# Patient Record
Sex: Female | Born: 1970 | Race: Black or African American | Hispanic: No | Smoking: Never smoker
Health system: Southern US, Community
[De-identification: ages and names within clinical notes are randomized; demographics above are authoritative.]

## PROBLEM LIST (undated history)

## (undated) DIAGNOSIS — M1990 Unspecified osteoarthritis, unspecified site: Secondary | ICD-10-CM

## (undated) DIAGNOSIS — K219 Gastro-esophageal reflux disease without esophagitis: Secondary | ICD-10-CM

## (undated) DIAGNOSIS — K589 Irritable bowel syndrome without diarrhea: Secondary | ICD-10-CM

## (undated) DIAGNOSIS — E785 Hyperlipidemia, unspecified: Secondary | ICD-10-CM

## (undated) DIAGNOSIS — A6000 Herpesviral infection of urogenital system, unspecified: Secondary | ICD-10-CM

## (undated) HISTORY — DX: Irritable bowel syndrome, unspecified: K58.9

## (undated) HISTORY — PX: WISDOM TOOTH EXTRACTION: SHX21

## (undated) HISTORY — DX: Gastro-esophageal reflux disease without esophagitis: K21.9

## (undated) HISTORY — DX: Hyperlipidemia, unspecified: E78.5

## (undated) HISTORY — DX: Unspecified osteoarthritis, unspecified site: M19.90

---

## 1998-02-06 ENCOUNTER — Other Ambulatory Visit: Admission: RE | Admit: 1998-02-06 | Discharge: 1998-02-06 | Payer: Self-pay | Admitting: *Deleted

## 1998-02-16 ENCOUNTER — Encounter: Admission: RE | Admit: 1998-02-16 | Discharge: 1998-02-16 | Payer: Self-pay | Admitting: Family Medicine

## 1999-03-26 ENCOUNTER — Other Ambulatory Visit: Admission: RE | Admit: 1999-03-26 | Discharge: 1999-03-26 | Payer: Self-pay | Admitting: *Deleted

## 2005-01-16 ENCOUNTER — Other Ambulatory Visit: Admission: RE | Admit: 2005-01-16 | Discharge: 2005-01-16 | Payer: Self-pay | Admitting: *Deleted

## 2005-10-29 ENCOUNTER — Encounter: Admission: RE | Admit: 2005-10-29 | Discharge: 2005-10-29 | Payer: Self-pay | Admitting: *Deleted

## 2005-12-23 ENCOUNTER — Other Ambulatory Visit: Admission: RE | Admit: 2005-12-23 | Discharge: 2005-12-23 | Payer: Self-pay | Admitting: *Deleted

## 2006-11-02 ENCOUNTER — Encounter: Admission: RE | Admit: 2006-11-02 | Discharge: 2006-11-02 | Payer: Self-pay | Admitting: *Deleted

## 2006-12-09 ENCOUNTER — Other Ambulatory Visit: Admission: RE | Admit: 2006-12-09 | Discharge: 2006-12-09 | Payer: Self-pay | Admitting: *Deleted

## 2007-07-12 ENCOUNTER — Ambulatory Visit: Payer: Self-pay | Admitting: Gastroenterology

## 2007-08-16 ENCOUNTER — Ambulatory Visit: Payer: Self-pay | Admitting: Gastroenterology

## 2007-10-28 HISTORY — PX: ANAL FISSURE REPAIR: SHX2312

## 2007-10-28 HISTORY — PX: SIGMOIDOSCOPY: SUR1295

## 2007-11-03 ENCOUNTER — Ambulatory Visit: Payer: Self-pay | Admitting: Gastroenterology

## 2007-11-15 ENCOUNTER — Encounter: Payer: Self-pay | Admitting: Gastroenterology

## 2007-11-15 ENCOUNTER — Ambulatory Visit (HOSPITAL_COMMUNITY): Admission: RE | Admit: 2007-11-15 | Discharge: 2007-11-15 | Payer: Self-pay | Admitting: Gastroenterology

## 2007-11-15 DIAGNOSIS — K648 Other hemorrhoids: Secondary | ICD-10-CM | POA: Insufficient documentation

## 2007-11-17 ENCOUNTER — Ambulatory Visit: Payer: Self-pay | Admitting: Gastroenterology

## 2007-12-30 DIAGNOSIS — K219 Gastro-esophageal reflux disease without esophagitis: Secondary | ICD-10-CM

## 2008-01-03 ENCOUNTER — Other Ambulatory Visit: Admission: RE | Admit: 2008-01-03 | Discharge: 2008-01-03 | Payer: Self-pay | Admitting: *Deleted

## 2008-01-17 ENCOUNTER — Ambulatory Visit: Payer: Self-pay | Admitting: Family Medicine

## 2008-01-17 DIAGNOSIS — Z9189 Other specified personal risk factors, not elsewhere classified: Secondary | ICD-10-CM

## 2008-01-17 DIAGNOSIS — K589 Irritable bowel syndrome without diarrhea: Secondary | ICD-10-CM | POA: Insufficient documentation

## 2008-01-18 ENCOUNTER — Ambulatory Visit: Payer: Self-pay | Admitting: Family Medicine

## 2008-01-18 LAB — CONVERTED CEMR LAB
Glucose, Urine, Semiquant: NEGATIVE
Protein, U semiquant: NEGATIVE
Specific Gravity, Urine: 1.015
WBC Urine, dipstick: NEGATIVE

## 2008-01-19 ENCOUNTER — Ambulatory Visit: Payer: Self-pay | Admitting: Family Medicine

## 2008-01-19 DIAGNOSIS — B009 Herpesviral infection, unspecified: Secondary | ICD-10-CM | POA: Insufficient documentation

## 2008-01-19 DIAGNOSIS — K602 Anal fissure, unspecified: Secondary | ICD-10-CM | POA: Insufficient documentation

## 2008-01-19 LAB — CONVERTED CEMR LAB
ALT: 8 units/L (ref 0–35)
AST: 12 units/L (ref 0–37)
BUN: 7 mg/dL (ref 6–23)
Basophils Absolute: 0 10*3/uL (ref 0.0–0.1)
Basophils Relative: 0.5 % (ref 0.0–1.0)
CO2: 24 meq/L (ref 19–32)
Calcium: 9.3 mg/dL (ref 8.4–10.5)
Creatinine, Ser: 0.7 mg/dL (ref 0.4–1.2)
Direct LDL: 129.9 mg/dL
Eosinophils Relative: 2.8 % (ref 0.0–5.0)
Glucose, Bld: 86 mg/dL (ref 70–99)
Hemoglobin: 11 g/dL — ABNORMAL LOW (ref 12.0–15.0)
Lymphocytes Relative: 27.5 % (ref 12.0–46.0)
MCHC: 31.4 g/dL (ref 30.0–36.0)
Monocytes Relative: 5.8 % (ref 3.0–11.0)
Neutro Abs: 4 10*3/uL (ref 1.4–7.7)
RBC: 4.37 M/uL (ref 3.87–5.11)
TSH: 0.95 microintl units/mL (ref 0.35–5.50)
Total Protein: 6.8 g/dL (ref 6.0–8.3)
VLDL: 19 mg/dL (ref 0–40)
WBC: 6.4 10*3/uL (ref 4.5–10.5)

## 2008-01-20 ENCOUNTER — Telehealth (INDEPENDENT_AMBULATORY_CARE_PROVIDER_SITE_OTHER): Payer: Self-pay | Admitting: *Deleted

## 2008-01-24 ENCOUNTER — Other Ambulatory Visit (INDEPENDENT_AMBULATORY_CARE_PROVIDER_SITE_OTHER): Payer: Self-pay | Admitting: General Surgery

## 2008-01-24 ENCOUNTER — Ambulatory Visit (HOSPITAL_COMMUNITY): Admission: RE | Admit: 2008-01-24 | Discharge: 2008-01-24 | Payer: Self-pay | Admitting: General Surgery

## 2008-02-24 ENCOUNTER — Encounter: Admission: RE | Admit: 2008-02-24 | Discharge: 2008-02-24 | Payer: Self-pay | Admitting: *Deleted

## 2008-03-09 ENCOUNTER — Encounter: Admission: RE | Admit: 2008-03-09 | Discharge: 2008-03-09 | Payer: Self-pay | Admitting: *Deleted

## 2008-10-31 ENCOUNTER — Ambulatory Visit: Payer: Self-pay | Admitting: Family Medicine

## 2008-10-31 DIAGNOSIS — J019 Acute sinusitis, unspecified: Secondary | ICD-10-CM

## 2009-04-13 ENCOUNTER — Encounter: Admission: RE | Admit: 2009-04-13 | Discharge: 2009-04-13 | Payer: Self-pay | Admitting: Gynecology

## 2009-04-19 ENCOUNTER — Ambulatory Visit: Payer: Self-pay | Admitting: Family Medicine

## 2010-09-24 ENCOUNTER — Encounter: Admission: RE | Admit: 2010-09-24 | Discharge: 2010-09-24 | Payer: Self-pay | Admitting: Gynecology

## 2010-12-16 ENCOUNTER — Encounter: Payer: Self-pay | Admitting: Family Medicine

## 2010-12-16 ENCOUNTER — Ambulatory Visit (INDEPENDENT_AMBULATORY_CARE_PROVIDER_SITE_OTHER): Payer: BC Managed Care – PPO | Admitting: Family Medicine

## 2010-12-16 VITALS — BP 110/60 | HR 104 | Temp 98.8°F | Wt 120.0 lb

## 2010-12-16 DIAGNOSIS — R002 Palpitations: Secondary | ICD-10-CM

## 2010-12-16 LAB — CBC WITH DIFFERENTIAL/PLATELET
Basophils Relative: 0.4 % (ref 0.0–3.0)
Eosinophils Relative: 2 % (ref 0.0–5.0)
HCT: 36.5 % (ref 36.0–46.0)
MCV: 87.6 fl (ref 78.0–100.0)
Monocytes Absolute: 0.3 10*3/uL (ref 0.1–1.0)
Monocytes Relative: 5.7 % (ref 3.0–12.0)
Neutrophils Relative %: 63.6 % (ref 43.0–77.0)
Platelets: 308 10*3/uL (ref 150.0–400.0)
RBC: 4.16 Mil/uL (ref 3.87–5.11)
WBC: 5.9 10*3/uL (ref 4.5–10.5)

## 2010-12-16 LAB — HEPATIC FUNCTION PANEL
Bilirubin, Direct: 0.1 mg/dL (ref 0.0–0.3)
Total Bilirubin: 0.4 mg/dL (ref 0.3–1.2)
Total Protein: 7.2 g/dL (ref 6.0–8.3)

## 2010-12-16 LAB — TSH: TSH: 0.65 u[IU]/mL (ref 0.35–5.50)

## 2010-12-16 LAB — BASIC METABOLIC PANEL
BUN: 8 mg/dL (ref 6–23)
Calcium: 9.2 mg/dL (ref 8.4–10.5)
Creatinine, Ser: 0.6 mg/dL (ref 0.4–1.2)

## 2010-12-16 NOTE — Progress Notes (Signed)
  Subjective:    Patient ID: Kayla Shaw, female    DOB: 01-Dec-1970, 40 y.o.   MRN: [id number]  HPI Here to re-establish with [place] after an absence of about 3 years and to discuss palpitations. She has noticed these for the past 5 months. They are intermittent, though they have been more frequent in the past 2 weeks. She has been under some work stress, and she thinks these may be stress related. No SOB or chest pains. These are not related to exertion. She drinks a lot of caffeinated soda every day.   Review of Systems  Constitutional: Negative.   Respiratory: Negative.   Cardiovascular: Positive for palpitations. Negative for chest pain and leg swelling.  Gastrointestinal: Negative.   Psychiatric/Behavioral: The patient is nervous/anxious.        Objective:   Physical Exam  Constitutional: She appears well-developed and well-nourished.  Neck: Neck supple. No thyromegaly present.  Cardiovascular: Regular rhythm, normal heart sounds and intact distal pulses.   No extrasystoles are present. Tachycardia present.  Exam reveals no gallop and no friction rub.   No murmur heard.      EKG normal   Psychiatric: She has a normal mood and affect. Her behavior is normal. Judgment and thought content normal.          Assessment & Plan:  These palpitations do seem to be related to stress, and I reassured her about this. Advised her to cut back on her use of caffeine. Get more exercise. We talked about ways to decrease her stress levels.

## 2010-12-17 ENCOUNTER — Telehealth: Payer: Self-pay

## 2010-12-17 NOTE — Telephone Encounter (Signed)
Pt aware of lab results 

## 2010-12-17 NOTE — Telephone Encounter (Signed)
Message copied by Madison Hickman on Tue Dec 17, 2010  8:46 AM ------      Message from: Dwaine Deter      Created: Mon Dec 16, 2010  5:16 PM       normal

## 2011-03-11 NOTE — Letter (Signed)
July 12, 2007    Kayla Shaw   RE:  SHATORA, WEATHERBEE  MRN:  [id number]  /  DOB:  May 17, 1971   Dear Ms. Shaw:   It is my pleasure to have treated you recently as a new patient in my  office.  I appreciate your confidence and the opportunity to participate  in your care.   Since I do have a busy inpatient endoscopy schedule and office schedule,  my office hours vary weekly.  I am, however, available for emergency  calls every day through my office.  If I cannot promptly meet an urgent  office appointment, another one of our gastroenterologists will be able  to assist you.   My well-trained staff are prepared to help you at all times.  For  emergencies after office hours, a physician from our gastroenterology  section is always available through my 24-hour answering service.   While you are under my care, I encourage discussion of your questions  and concerns, and I will be happy to return your calls as soon as I am  available.   Once again, I welcome you as a new patient and I look forward to a happy  and healthy relationship.    Sincerely,      Barbette Hair. Arlyce Dice, MD,FACG  Electronically Signed   RDK/MedQ  DD: 07/12/2007  DT: 07/12/2007  Job #: [id number]

## 2011-03-11 NOTE — Assessment & Plan Note (Signed)
[facility]                         GASTROENTEROLOGY OFFICE NOTE   NAME:Kayla Shaw                       MRN:          [id number]  DATE:07/12/2007                            DOB:          27-Jun-1971    PROBLEM:  1. Nausea.  2. Hemorrhoids.   Ms. Kayla Shaw is a pleasant 40 year old African American female here for  evaluation of above.  She complains of nausea, most frequently in the  mornings.  It is improved with eating.  She complains of gas, bloating.  Bowels are somewhat erratic.  She is also suffering from minimal rectal  bleeding consisting of bright red blood on the toilet tissue.  She  denies rectal pain.  There is no history of pyrosis, sore throat,  coughing or melena.  She has a history of IBS.   PAST MEDICAL HISTORY:  Anal fissure that was operated on in 1995.   FAMILY HISTORY:  Noncontributory.   MEDICATIONS:  HyoMax p.r.n.   SHE HAS NO ALLERGIES.   She does not smoke, she has 4-5 drinks a week.  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   REVIEW OF SYSTEMS:  Was reviewed and is negative.   PHYSICAL EXAMINATION:  Pulse 54, blood pressure 106/66, weight 111.  HEENT: EOMI.  PERRLA.  Sclerae are anicteric.  Conjunctivae are pink.  NECK:  Supple without thyromegaly, adenopathy or carotid bruits.  CHEST:  Clear to auscultation and percussion without adventitious  sounds.  CARDIAC:  Regular rhythm; normal S1 S2.  There are no murmurs, gallops  or rubs.  ABDOMEN:  Bowel sounds are normoactive.  Abdomen is soft, nontender and  nondistended.  There are no abdominal masses, tenderness, splenic  enlargement or hepatomegaly.  EXTREMITIES:  Full range of motion.  No cyanosis, clubbing or edema.  RECTAL:  There are no external rectal lesions.   IMPRESSION:  1. Nausea.  Symptoms could be due to reflux.  2. Limited rectal bleeding -- Most likely secondary to hemorrhoids.   RECOMMENDATION:  1. Trial of Protonix 40 mg a day.  2. Anusol  HC suppositories nightly.  3. Ms. Kayla Shaw was carefully instructed to contact me in 3-4 days to      report her progress.  If her      nausea persists then I would consider upper endoscopy.  If her      bleeding does not improve with suppositories I would consider      sigmoidoscopy and possible banding of hemorrhoids.     Barbette Hair. Arlyce Dice, MD,FACG  Electronically Signed    RDK/MedQ  DD: 07/12/2007  DT: 07/12/2007  Job #: [id number]   cc:   Almedia Balls. Randell Patient, M.D.

## 2011-03-11 NOTE — Op Note (Signed)
NAMENASIAH, POLINSKY                ACCOUNT NO.:  [id number]   MEDICAL RECORD NO.:  [id number]          PATIENT TYPE:  AMB   LOCATION:  DAY                          FACILITY:  [facility]   PHYSICIAN:  Anselm Pancoast. Weatherly, M.D.DATE OF BIRTH:  [birth year]   DATE OF PROCEDURE:  01/24/2008  DATE OF DISCHARGE:                               OPERATIVE REPORT   PREOPERATIVE DIAGNOSIS:  Recurrent posterior anal fissure.   POSTOPERATIVE DIAGNOSIS:  Recurrent posterior anal fissure.   OPERATION:  Examination under anesthesia, posterior internal  sphincterotomy and excision of sentinel pile.  General anesthesia.   SURGEON:  Anselm Pancoast. Zachery Dakins, M.D.   HISTORY:  Kayla Shaw is a 40 year old female who approximately 10  years ago she had a internal sphincterotomy by history by Dr. Gerrit Friends.  I  have not been able to identify any records, but however for about six  months she has had recurring problems with bleeding, pain at the  posterior.  She was seen by Dr. Arlyce Dice who thought this was a hemorrhoid  and saw that it was a fissure.  It has been Botox injected, improved,  but then recurred and I saw her in the office last week.  You could see  the fissure posteriorly with a sentinel pile.  I could not see any  actual incision where she has had a previous internal sphincterotomy by  Dr. Gerrit Friends and I recommended that we do her with general anesthesia in  lithotomy position and could not find any evidence of an infection.  We  would do an internal sphincterotomy.  I tried to locate the old  operative record, but have not been successful.  The patient was  scheduled at [facility] today because of very busy schedule, it was  moved over here and she is here for the planned procedure at this time.   The patient preoperatively was given 3 grams of Unasyn and positioned up  in the stirrups after induction of general anesthesia with LMA tube.  First I inspected the anus very carefully.  I could not see any  evidence  of the little incision area where an internal sphincterotomy possibly  could have been performed and then with the anoscope there is nothing  anterior.  Posterior you could see an obvious fissure and there is a  little catch where the stool kind of hangs up and then there is also a  pretty large sentinel pile that is attached a little higher than the  usual pile.  I elected to prep the area with Betadine solution and then  used cautery to kind of nick the skin where this little catch lip was  and then used a hemostat to go between the normal sphincter and internal  sphincter up to the dentate line area.  I then elevated it this is all  posterior and divided this with cautery.  I then excised the little  sentinel pile and actually closed the mucosa and everything with 2-0  running chromic and a little single stitch on the outside so that the  little area is left open in case  there would be a little bit of drainage  but hopefully this will heal up the healing.  After inspecting now, she  is coming back with able to swallow and etc. I think I got a good  sphincterotomy performed and  then I put about 10 mL of 0.5% Marcaine with adrenaline about 5 mL of  each side for immediate postoperative pain.  I then put a little 4x4s  over the area, held in place with stretch panties and she will be  released after a short stay in recovery.           ______________________________  Anselm Pancoast. Zachery Dakins, M.D.     WJW/MEDQ  D:  01/24/2008  T:  01/25/2008  Job:  [id number]   cc:   Barbette Hair. Arlyce Dice, MD,FACG  [address]  [place]  [place] [address]

## 2011-03-11 NOTE — Assessment & Plan Note (Signed)
[facility]                         GASTROENTEROLOGY OFFICE NOTE   NAME:HARRISAdyn, Shaw                       MRN:          [id number]  DATE:11/03/2007                            DOB:          [birth year]    PROBLEM:  Rectal itching and discomfort.   Ms. Kayla Shaw has returned for followup.  Despite rectal suppositories, she  continues to complain of rectal itching and soreness.  Pain is  especially severe after a bowel movement.   EXAM:  Pulse 80, blood pressure 118/58, weight 114.   IMPRESSION:  Symptomatic internal hemorrhoids.  I think this is more  likely than an anal fissure.   RECOMMENDATIONS:  Sigmoidoscopy with band ligation of her hemorrhoids.  I will do a careful anoscopic exam to be certain that she does not have  a symptomatic fissure.  Should a fissure be seen, then I will not  proceed with band ligation.     Barbette Hair. Arlyce Dice, MD,FACG  Electronically Signed    RDK/MedQ  DD: 11/03/2007  DT: 11/03/2007  Job #: [id number]

## 2011-03-11 NOTE — Assessment & Plan Note (Signed)
[facility]                         GASTROENTEROLOGY OFFICE NOTE   NAME:HARRISBrittay, [name]                       MRN:          [id number]  DATE:08/16/2007                            DOB:          [birth year]    PROBLEM:  1. Reflux.  2. Rectal pain.   Ms. Kayla Shaw has returned for scheduled followup.  Nausea has subsided.  She has modified her diet, eating less fried foods and smaller portions.  On this regimen, she has no arbitrary complaints.  Her rectal soreness  largely subsided until she was recently placed on antibiotics for a UTI  where she developed diarrhea.  With diarrhea, she has had recurrent  rectal pain with defecation with minimal amounts of bleeding.  This,  too, is improving.   EXAM:  Pulse 64, blood pressure 104/70.   IMPRESSION:  1. Nonspecific dyspepsia - resolved.  2. Probable anal fissure versus internal hemorrhoids.   RECOMMENDATIONS:  Continue current regimen including local rectal care  consisting of warm soaks and Anusol suppositories.     Barbette Hair. Arlyce Dice, MD,FACG  Electronically Signed    RDK/MedQ  DD: 08/16/2007  DT: 08/16/2007  Job #: [id number]   cc:   Almedia Balls. Randell Patient, M.D.

## 2011-06-20 ENCOUNTER — Emergency Department (HOSPITAL_COMMUNITY): Payer: BC Managed Care – PPO

## 2011-06-20 ENCOUNTER — Emergency Department (HOSPITAL_COMMUNITY)
Admission: EM | Admit: 2011-06-20 | Discharge: 2011-06-20 | Disposition: A | Discharge status: Home / Self Care | Payer: BC Managed Care – PPO | Attending: Emergency Medicine | Admitting: Emergency Medicine

## 2011-06-20 DIAGNOSIS — R1110 Vomiting, unspecified: Secondary | ICD-10-CM | POA: Insufficient documentation

## 2011-06-20 DIAGNOSIS — IMO0001 Reserved for inherently not codable concepts without codable children: Secondary | ICD-10-CM | POA: Insufficient documentation

## 2011-06-20 DIAGNOSIS — M545 Low back pain, unspecified: Secondary | ICD-10-CM | POA: Insufficient documentation

## 2011-06-20 DIAGNOSIS — R509 Fever, unspecified: Secondary | ICD-10-CM | POA: Insufficient documentation

## 2011-06-20 DIAGNOSIS — K589 Irritable bowel syndrome without diarrhea: Secondary | ICD-10-CM | POA: Insufficient documentation

## 2011-06-20 DIAGNOSIS — E876 Hypokalemia: Secondary | ICD-10-CM | POA: Insufficient documentation

## 2011-06-20 LAB — URINALYSIS, ROUTINE W REFLEX MICROSCOPIC
Bilirubin Urine: NEGATIVE
Glucose, UA: NEGATIVE mg/dL
Ketones, ur: NEGATIVE mg/dL
Leukocytes, UA: NEGATIVE
Protein, ur: NEGATIVE mg/dL
pH: 5.5 (ref 5.0–8.0)

## 2011-06-20 LAB — CBC
HCT: 37 % (ref 36.0–46.0)
Hemoglobin: 12.7 g/dL (ref 12.0–15.0)
MCH: 29.5 pg (ref 26.0–34.0)
MCHC: 34.3 g/dL (ref 30.0–36.0)
MCV: 86 fL (ref 78.0–100.0)
RBC: 4.3 MIL/uL (ref 3.87–5.11)

## 2011-06-20 LAB — DIFFERENTIAL
Basophils Relative: 0 % (ref 0–1)
Lymphs Abs: 0.7 10*3/uL (ref 0.7–4.0)
Monocytes Absolute: 0.1 10*3/uL (ref 0.1–1.0)
Monocytes Relative: 1 % — ABNORMAL LOW (ref 3–12)
Neutro Abs: 9.2 10*3/uL — ABNORMAL HIGH (ref 1.7–7.7)
Neutrophils Relative %: 92 % — ABNORMAL HIGH (ref 43–77)

## 2011-06-20 LAB — COMPREHENSIVE METABOLIC PANEL
ALT: 23 U/L (ref 0–35)
AST: 17 U/L (ref 0–37)
Alkaline Phosphatase: 64 U/L (ref 39–117)
CO2: 25 mEq/L (ref 19–32)
Calcium: 9.7 mg/dL (ref 8.4–10.5)
Chloride: 103 mEq/L (ref 96–112)
GFR calc non Af Amer: >60 mL/min (ref >60)
Glucose, Bld: 111 mg/dL — ABNORMAL HIGH (ref 70–99)
Potassium: 3.3 mEq/L — ABNORMAL LOW (ref 3.5–5.1)
Sodium: 137 mEq/L (ref 135–145)

## 2011-06-20 LAB — URINE MICROSCOPIC-ADD ON

## 2011-06-20 LAB — GLUCOSE, CAPILLARY

## 2011-07-21 LAB — CBC
HCT: 32.8 — ABNORMAL LOW
Hemoglobin: 11 — ABNORMAL LOW
MCV: 77.2 — ABNORMAL LOW
RDW: 14.1
WBC: 8.4

## 2011-07-21 LAB — DIFFERENTIAL
Eosinophils Absolute: 0.3
Eosinophils Relative: 3
Lymphocytes Relative: 23
Lymphs Abs: 1.9
Monocytes Absolute: 0.5

## 2011-09-16 LAB — OB RESULTS CONSOLE ANTIBODY SCREEN: Antibody Screen: NEGATIVE

## 2011-09-16 LAB — OB RESULTS CONSOLE GC/CHLAMYDIA: Gonorrhea: NEGATIVE

## 2011-09-16 LAB — OB RESULTS CONSOLE ABO/RH: RH Type: POSITIVE

## 2011-09-16 LAB — OB RESULTS CONSOLE RPR: RPR: NONREACTIVE

## 2011-10-28 DIAGNOSIS — A6000 Herpesviral infection of urogenital system, unspecified: Secondary | ICD-10-CM

## 2011-10-28 HISTORY — DX: Herpesviral infection of urogenital system, unspecified: A60.00

## 2011-10-28 NOTE — L&D Delivery Note (Signed)
Operative Delivery Note At 6:59 PM a viable female was delivered via Vaginal, Vacuum Investment banker, operational).  Presentation: vertex; Position: Left,, Occiput,, Anterior; Station: +3.  Verbal consent: obtained from patient.  Risks and benefits discussed in detail.  Risks include, but are not limited to the risks of anesthesia, bleeding, infection, damage to maternal tissues, fetal cephalhematoma.  There is also the risk of inability to effect vaginal delivery of the head, or shoulder dystocia that cannot be resolved by established maneuvers, leading to the need for emergency cesarean section.  APGAR: 8, 9; weight .   Placenta status: Intact, Spontaneous.   Cord: 3 vessels with the following complications: None.  Cord pH: not obtained  Anesthesia: Epidural  Instruments: Mushroom delivered head easily with one pull. No popoff. Episiotomy: None Lacerations: 2nd degree Suture Repair: 3.0 chromic Est. Blood Loss (mL): 300  Mom to postpartum.  Baby to nursery-stable.  Ghassan Coggeshall L 04/07/2012, 7:14 PM

## 2012-03-29 LAB — OB RESULTS CONSOLE GBS: GBS: POSITIVE

## 2012-04-07 ENCOUNTER — Encounter (HOSPITAL_COMMUNITY): Payer: Self-pay | Admitting: *Deleted

## 2012-04-07 ENCOUNTER — Encounter (HOSPITAL_COMMUNITY): Payer: Self-pay | Admitting: Anesthesiology

## 2012-04-07 ENCOUNTER — Inpatient Hospital Stay (HOSPITAL_COMMUNITY)
Admission: AD | Admit: 2012-04-07 | Discharge: 2012-04-09 | DRG: 373 | Disposition: A | Discharge status: Home / Self Care | Payer: BC Managed Care – PPO | Source: Ambulatory Visit | Attending: Obstetrics and Gynecology | Admitting: Obstetrics and Gynecology

## 2012-04-07 ENCOUNTER — Inpatient Hospital Stay (HOSPITAL_COMMUNITY): Payer: BC Managed Care – PPO | Admitting: Anesthesiology

## 2012-04-07 DIAGNOSIS — O99892 Other specified diseases and conditions complicating childbirth: Secondary | ICD-10-CM | POA: Diagnosis present

## 2012-04-07 DIAGNOSIS — O09529 Supervision of elderly multigravida, unspecified trimester: Secondary | ICD-10-CM | POA: Diagnosis present

## 2012-04-07 DIAGNOSIS — O878 Other venous complications in the puerperium: Secondary | ICD-10-CM | POA: Diagnosis present

## 2012-04-07 DIAGNOSIS — K648 Other hemorrhoids: Secondary | ICD-10-CM

## 2012-04-07 DIAGNOSIS — Z22330 Carrier of Group B streptococcus: Secondary | ICD-10-CM

## 2012-04-07 DIAGNOSIS — K219 Gastro-esophageal reflux disease without esophagitis: Secondary | ICD-10-CM

## 2012-04-07 DIAGNOSIS — K589 Irritable bowel syndrome without diarrhea: Secondary | ICD-10-CM

## 2012-04-07 DIAGNOSIS — B009 Herpesviral infection, unspecified: Secondary | ICD-10-CM

## 2012-04-07 DIAGNOSIS — J019 Acute sinusitis, unspecified: Secondary | ICD-10-CM

## 2012-04-07 DIAGNOSIS — K649 Unspecified hemorrhoids: Secondary | ICD-10-CM | POA: Diagnosis present

## 2012-04-07 DIAGNOSIS — K602 Anal fissure, unspecified: Secondary | ICD-10-CM

## 2012-04-07 DIAGNOSIS — Z9189 Other specified personal risk factors, not elsewhere classified: Secondary | ICD-10-CM

## 2012-04-07 HISTORY — DX: Herpesviral infection of urogenital system, unspecified: A60.00

## 2012-04-07 LAB — CBC
HCT: 40.6 % (ref 36.0–46.0)
MCHC: 33.3 g/dL (ref 30.0–36.0)
RDW: 14.4 % (ref 11.5–15.5)

## 2012-04-07 LAB — RPR: RPR Ser Ql: NONREACTIVE

## 2012-04-07 LAB — RUBELLA SCREEN: Rubella: 33 IU/mL — ABNORMAL HIGH

## 2012-04-07 MED ORDER — FLEET ENEMA 7-19 GM/118ML RE ENEM: 1.0000 | ENEMA | RECTAL | Status: DC | PRN

## 2012-04-07 MED ORDER — PENICILLIN G POTASSIUM 5000000 UNITS IJ SOLR
5.0000 10*6.[IU] | Freq: Once | INTRAVENOUS | Status: AC
  Administered 2012-04-07: 5 10*6.[IU] via INTRAVENOUS
  Filled 2012-04-07: qty 5

## 2012-04-07 MED ORDER — LACTATED RINGERS IV SOLN
500.0000 mL | Freq: Once | INTRAVENOUS | Status: AC
  Administered 2012-04-07: 1000 mL via INTRAVENOUS

## 2012-04-07 MED ORDER — TERBUTALINE SULFATE 1 MG/ML IJ SOLN: 0.2500 mg | Freq: Once | INTRAMUSCULAR | Status: DC | PRN

## 2012-04-07 MED ORDER — OXYTOCIN BOLUS FROM INFUSION
500.0000 mL | Freq: Once | INTRAVENOUS | Status: DC
  Filled 2012-04-07: qty 500

## 2012-04-07 MED ORDER — OXYCODONE-ACETAMINOPHEN 5-325 MG PO TABS: 1.0000 | ORAL_TABLET | ORAL | Status: DC | PRN

## 2012-04-07 MED ORDER — LIDOCAINE HCL (PF) 1 % IJ SOLN
30.0000 mL | INTRAMUSCULAR | Status: DC | PRN
  Administered 2012-04-07: 30 mL via SUBCUTANEOUS
  Filled 2012-04-07: qty 30

## 2012-04-07 MED ORDER — DIBUCAINE 1 % RE OINT: 1.0000 "application " | TOPICAL_OINTMENT | RECTAL | Status: DC | PRN

## 2012-04-07 MED ORDER — PENICILLIN G POTASSIUM 5000000 UNITS IJ SOLR
2.5000 10*6.[IU] | INTRAMUSCULAR | Status: DC
  Administered 2012-04-07 (×2): 2.5 10*6.[IU] via INTRAVENOUS
  Filled 2012-04-07 (×5): qty 2.5

## 2012-04-07 MED ORDER — DIPHENHYDRAMINE HCL 25 MG PO CAPS: 25.0000 mg | ORAL_CAPSULE | Freq: Four times a day (QID) | ORAL | Status: DC | PRN

## 2012-04-07 MED ORDER — CITRIC ACID-SODIUM CITRATE 334-500 MG/5ML PO SOLN: 30.0000 mL | ORAL | Status: DC | PRN

## 2012-04-07 MED ORDER — SODIUM BICARBONATE 8.4 % IV SOLN
INTRAVENOUS | Status: DC | PRN
  Administered 2012-04-07: 4 mL via EPIDURAL

## 2012-04-07 MED ORDER — LANOLIN HYDROUS EX OINT: TOPICAL_OINTMENT | CUTANEOUS | Status: DC | PRN

## 2012-04-07 MED ORDER — OXYTOCIN 20 UNITS IN LACTATED RINGERS INFUSION - SIMPLE
1.0000 m[IU]/min | INTRAVENOUS | Status: DC
  Administered 2012-04-07: 2 m[IU]/min via INTRAVENOUS
  Filled 2012-04-07: qty 1000

## 2012-04-07 MED ORDER — ACETAMINOPHEN 325 MG PO TABS: 650.0000 mg | ORAL_TABLET | ORAL | Status: DC | PRN

## 2012-04-07 MED ORDER — SENNOSIDES-DOCUSATE SODIUM 8.6-50 MG PO TABS
2.0000 | ORAL_TABLET | Freq: Every day | ORAL | Status: DC
  Administered 2012-04-07 – 2012-04-08 (×2): 2 via ORAL

## 2012-04-07 MED ORDER — ONDANSETRON HCL 4 MG/2ML IJ SOLN: 4.0000 mg | INTRAMUSCULAR | Status: DC | PRN

## 2012-04-07 MED ORDER — ZOLPIDEM TARTRATE 5 MG PO TABS: 5.0000 mg | ORAL_TABLET | Freq: Every evening | ORAL | Status: DC | PRN

## 2012-04-07 MED ORDER — WITCH HAZEL-GLYCERIN EX PADS
1.0000 "application " | MEDICATED_PAD | CUTANEOUS | Status: DC | PRN
  Administered 2012-04-08: 1 via TOPICAL

## 2012-04-07 MED ORDER — FLEET ENEMA 7-19 GM/118ML RE ENEM: 1.0000 | ENEMA | Freq: Every day | RECTAL | Status: DC | PRN

## 2012-04-07 MED ORDER — FENTANYL 2.5 MCG/ML BUPIVACAINE 1/10 % EPIDURAL INFUSION (WH - ANES)
14.0000 mL/h | INTRAMUSCULAR | Status: DC
  Administered 2012-04-07 (×2): 14 mL/h via EPIDURAL
  Filled 2012-04-07 (×3): qty 60

## 2012-04-07 MED ORDER — DIPHENHYDRAMINE HCL 50 MG/ML IJ SOLN: 12.5000 mg | INTRAMUSCULAR | Status: DC | PRN

## 2012-04-07 MED ORDER — LACTATED RINGERS IV SOLN: 500.0000 mL | INTRAVENOUS | Status: DC | PRN

## 2012-04-07 MED ORDER — IBUPROFEN 600 MG PO TABS: 600.0000 mg | ORAL_TABLET | Freq: Four times a day (QID) | ORAL | Status: DC | PRN

## 2012-04-07 MED ORDER — IBUPROFEN 600 MG PO TABS
600.0000 mg | ORAL_TABLET | Freq: Four times a day (QID) | ORAL | Status: DC
  Administered 2012-04-08 – 2012-04-09 (×7): 600 mg via ORAL
  Filled 2012-04-07 (×7): qty 1

## 2012-04-07 MED ORDER — TETANUS-DIPHTH-ACELL PERTUSSIS 5-2.5-18.5 LF-MCG/0.5 IM SUSP
0.5000 mL | Freq: Once | INTRAMUSCULAR | Status: AC
  Administered 2012-04-08: 0.5 mL via INTRAMUSCULAR
  Filled 2012-04-07: qty 0.5

## 2012-04-07 MED ORDER — VALACYCLOVIR HCL 500 MG PO TABS
500.0000 mg | ORAL_TABLET | Freq: Every day | ORAL | Status: DC
  Administered 2012-04-07 – 2012-04-09 (×3): 500 mg via ORAL
  Filled 2012-04-07 (×3): qty 1

## 2012-04-07 MED ORDER — PHENYLEPHRINE 40 MCG/ML (10ML) SYRINGE FOR IV PUSH (FOR BLOOD PRESSURE SUPPORT): 80.0000 ug | PREFILLED_SYRINGE | INTRAVENOUS | Status: DC | PRN

## 2012-04-07 MED ORDER — LACTATED RINGERS IV SOLN
INTRAVENOUS | Status: DC
  Administered 2012-04-07: 125 mL/h via INTRAVENOUS

## 2012-04-07 MED ORDER — OXYTOCIN 20 UNITS IN LACTATED RINGERS INFUSION - SIMPLE
125.0000 mL/h | Freq: Once | INTRAVENOUS | Status: AC
  Administered 2012-04-07: 999 mL/h via INTRAVENOUS

## 2012-04-07 MED ORDER — MEASLES, MUMPS & RUBELLA VAC ~~LOC~~ INJ: 0.5000 mL | INJECTION | Freq: Once | SUBCUTANEOUS | Status: DC

## 2012-04-07 MED ORDER — SIMETHICONE 80 MG PO CHEW: 80.0000 mg | CHEWABLE_TABLET | ORAL | Status: DC | PRN

## 2012-04-07 MED ORDER — ONDANSETRON HCL 4 MG PO TABS: 4.0000 mg | ORAL_TABLET | ORAL | Status: DC | PRN

## 2012-04-07 MED ORDER — PRENATAL MULTIVITAMIN CH
1.0000 | ORAL_TABLET | Freq: Every day | ORAL | Status: DC
  Administered 2012-04-07 – 2012-04-09 (×3): 1 via ORAL
  Filled 2012-04-07 (×3): qty 1

## 2012-04-07 MED ORDER — PHENYLEPHRINE 40 MCG/ML (10ML) SYRINGE FOR IV PUSH (FOR BLOOD PRESSURE SUPPORT)
80.0000 ug | PREFILLED_SYRINGE | INTRAVENOUS | Status: DC | PRN
  Filled 2012-04-07: qty 5

## 2012-04-07 MED ORDER — BENZOCAINE-MENTHOL 20-0.5 % EX AERO
1.0000 "application " | INHALATION_SPRAY | CUTANEOUS | Status: DC | PRN
  Filled 2012-04-07: qty 56

## 2012-04-07 MED ORDER — BISACODYL 10 MG RE SUPP: 10.0000 mg | Freq: Every day | RECTAL | Status: DC | PRN

## 2012-04-07 MED ORDER — FENTANYL 2.5 MCG/ML BUPIVACAINE 1/10 % EPIDURAL INFUSION (WH - ANES)
INTRAMUSCULAR | Status: DC | PRN
  Administered 2012-04-07: 14 mL/h via EPIDURAL

## 2012-04-07 MED ORDER — EPHEDRINE 5 MG/ML INJ
10.0000 mg | INTRAVENOUS | Status: DC | PRN
  Filled 2012-04-07: qty 4

## 2012-04-07 MED ORDER — MEDROXYPROGESTERONE ACETATE 150 MG/ML IM SUSP: 150.0000 mg | INTRAMUSCULAR | Status: DC | PRN

## 2012-04-07 MED ORDER — ONDANSETRON HCL 4 MG/2ML IJ SOLN: 4.0000 mg | Freq: Four times a day (QID) | INTRAMUSCULAR | Status: DC | PRN

## 2012-04-07 MED ORDER — EPHEDRINE 5 MG/ML INJ: 10.0000 mg | INTRAVENOUS | Status: DC | PRN

## 2012-04-07 NOTE — MAU Note (Signed)
Dr. Vincente Poli notified of pt's cervical change, MD to place orders for admission, GBS positive protocol.

## 2012-04-07 NOTE — H&P (Signed)
41 year old G 2 P 0010 at 75 w 3 days presented in active labor this am. Now status post epidural. PNC GBBS is positive  Afebrile Vital signs stable Fetal heart rate is reactive Good contraction pattern Lung CTAB Car RRR Leopold 7 1/2 Cervix is 90% 6 cm -2 Vertex Arom  Clear fluid  IMPRESSION: IUP at 38 w 3 days Labor GBBS positive  PLAN: PCN  Follow labor curve

## 2012-04-07 NOTE — MAU Note (Signed)
Contractions since last night, vomiting x 1 hour. Denies ROM.

## 2012-04-07 NOTE — MAU Note (Signed)
Pt states was 3cm in office yesterday, now is 4cm, will walk and recheck pt's cervix at 8, then call MD with results.

## 2012-04-07 NOTE — Anesthesia Procedure Notes (Signed)

## 2012-04-07 NOTE — Anesthesia Preprocedure Evaluation (Addendum)
Anesthesia Evaluation  Patient identified by MRN, date of birth, ID band Patient awake    Reviewed: Allergy & Precautions, H&P , Patient's Chart, lab work & pertinent test results  Airway Mallampati: II TM Distance: >3 FB Neck ROM: full    Dental  (+) Teeth Intact   Pulmonary  breath sounds clear to auscultation        Cardiovascular Rhythm:regular Rate:Normal     Neuro/Psych    GI/Hepatic GERD-  ,  Endo/Other    Renal/GU      Musculoskeletal   Abdominal   Peds  Hematology  (+) FACTOR VIII (HEMOPHILIA) ,   Anesthesia Other Findings       Reproductive/Obstetrics (+) Pregnancy                           Anesthesia Physical Anesthesia Plan  ASA: II  Anesthesia Plan: Epidural   Post-op Pain Management:    Induction:   Airway Management Planned:   Additional Equipment:   Intra-op Plan:   Post-operative Plan:   Informed Consent: I have reviewed the patients History and Physical, chart, labs and discussed the procedure including the risks, benefits and alternatives for the proposed anesthesia with the patient or authorized representative who has indicated his/her understanding and acceptance.   Dental Advisory Given  Plan Discussed with:   Anesthesia Plan Comments: (Labs checked- platelets confirmed with RN in room. Fetal heart tracing, per RN, reported to be stable enough for sitting procedure. Discussed epidural, and patient consents to the procedure:  included risk of possible headache,backache, failed block, allergic reaction, and nerve injury. This patient was asked if she had any questions or concerns before the procedure started. )        Anesthesia Quick Evaluation

## 2012-04-08 LAB — CBC
HCT: 30.2 % — ABNORMAL LOW (ref 36.0–46.0)
MCV: 86.3 fL (ref 78.0–100.0)
Platelets: 188 10*3/uL (ref 150–400)
RBC: 3.5 MIL/uL — ABNORMAL LOW (ref 3.87–5.11)
WBC: 17.6 10*3/uL — ABNORMAL HIGH (ref 4.0–10.5)

## 2012-04-08 NOTE — Progress Notes (Signed)
Post Partum Day 1 Subjective: no complaints, up ad lib, voiding and tolerating PO  Objective: Blood pressure 101/66, pulse 90, temperature 98.2 F (36.8 C), temperature source Oral, resp. rate 18, height 5\' 2"  (1.575 m), weight 71.668 kg (158 lb), last menstrual period 06/13/2011, SpO2 100.00%, unknown if currently breastfeeding.  Physical Exam:  General: alert and cooperative Lochia: appropriate Uterine Fundus: firm Incision: perineum intact DVT Evaluation: No evidence of DVT seen on physical exam.   Basename 04/08/12 0508 04/07/12 0920  HGB 10.0* 13.5  HCT 30.2* 40.6    Assessment/Plan: Plan for discharge tomorrow   LOS: 1 day   Lemma Tetro G 04/08/2012, 8:14 AM

## 2012-04-09 MED ORDER — IBUPROFEN 600 MG PO TABS: 600.0000 mg | ORAL_TABLET | Freq: Four times a day (QID) | ORAL | Status: AC

## 2012-04-09 NOTE — Progress Notes (Signed)
Post Partum Day 2 Subjective: no complaints, up ad lib, voiding, tolerating PO and + flatus  Objective: Blood pressure 103/67, pulse 82, temperature 98.2 F (36.8 C), temperature source Oral, resp. rate 18, height 5\' 2"  (1.575 m), weight 71.668 kg (158 lb), last menstrual period 06/13/2011, SpO2 100.00%, unknown if currently breastfeeding.  Physical Exam:  General: alert and cooperative Lochia: appropriate Uterine Fundus: firm Incision: perineum intact , sm hemorrhoids DVT Evaluation: No evidence of DVT seen on physical exam.   Basename 04/08/12 0508 04/07/12 0920  HGB 10.0* 13.5  HCT 30.2* 40.6    Assessment/Plan: Discharge home   LOS: 2 days   Kayla Shaw 04/09/2012, 8:02 AM

## 2012-04-09 NOTE — Discharge Summary (Signed)
Obstetric Discharge Summary Reason for Admission: onset of labor Prenatal Procedures: ultrasound Intrapartum Procedures: vacuum Postpartum Procedures: none Complications-Operative and Postpartum: 2 degree perineal laceration Hemoglobin  Date Value Range Status  04/08/2012 10.0* 12.0 - 15.0 g/dL Final     REPEATED TO VERIFY     DELTA CHECK NOTED     HCT  Date Value Range Status  04/08/2012 30.2* 36.0 - 46.0 % Final    Physical Exam:  General: alert and cooperative Lochia: appropriate Uterine Fundus: firm Incision: perineum intact, small hemorrhoids DVT Evaluation: No evidence of DVT seen on physical exam.  Discharge Diagnoses: Term Pregnancy-delivered  Discharge Information: Date: 04/09/2012 Activity: pelvic rest Diet: routine Medications: PNV, Ibuprofen and valtrex Condition: improved Instructions: refer to practice specific booklet Discharge to: home   Newborn Data: Live born female  Birth Weight: 6 lb 13 oz (3090 g) APGAR: 8, 9  Home with mother.  Kaenan Jake G 04/09/2012, 8:09 AM

## 2012-07-21 ENCOUNTER — Encounter: Payer: Self-pay | Admitting: Family Medicine

## 2012-07-21 ENCOUNTER — Ambulatory Visit (INDEPENDENT_AMBULATORY_CARE_PROVIDER_SITE_OTHER): Payer: BC Managed Care – PPO | Admitting: Family Medicine

## 2012-07-21 VITALS — BP 108/70 | HR 76 | Temp 98.4°F | Wt 129.0 lb

## 2012-07-21 DIAGNOSIS — M7551 Bursitis of right shoulder: Secondary | ICD-10-CM

## 2012-07-21 DIAGNOSIS — M67919 Unspecified disorder of synovium and tendon, unspecified shoulder: Secondary | ICD-10-CM

## 2012-07-21 MED ORDER — DICLOFENAC SODIUM 75 MG PO TBEC: 75.0000 mg | DELAYED_RELEASE_TABLET | Freq: Two times a day (BID) | ORAL | Status: DC

## 2012-07-21 NOTE — Progress Notes (Signed)
  Subjective:    Patient ID: Kayla Shaw, female    DOB: [birth year], 41 y.o.   MRN: [id number]  HPI Here for 3 days of sharp pains in the anterior right shoulder. No neck pain. No recent trauma, but she had a baby several months ago and she thinks carrying the heavy baby and carrier around has stressed the shoulder. Also this past weekend she was playing with her husband and child in the bed, and this pain started immediately after that, so this may have triggered something. She is using heat and Motrin with little benefit.    Review of Systems  Constitutional: Negative.   Musculoskeletal: Positive for arthralgias.       Objective:   Physical Exam  Constitutional: She appears well-developed and well-nourished.  Musculoskeletal:       She is tender in the anterior right shoulder over the subacromial area, and to a lesser extent over the superior shoulder. No crepitus. Full ROM.           Assessment & Plan:  Switch to ice and rest. Try Diclofenac. Recheck prn

## 2012-10-07 ENCOUNTER — Other Ambulatory Visit: Payer: Self-pay | Admitting: Obstetrics and Gynecology

## 2012-10-07 DIAGNOSIS — Z1231 Encounter for screening mammogram for malignant neoplasm of breast: Secondary | ICD-10-CM

## 2012-11-02 ENCOUNTER — Other Ambulatory Visit (INDEPENDENT_AMBULATORY_CARE_PROVIDER_SITE_OTHER): Payer: BC Managed Care – PPO

## 2012-11-02 DIAGNOSIS — Z0000 Encounter for general adult medical examination without abnormal findings: Secondary | ICD-10-CM

## 2012-11-02 LAB — BASIC METABOLIC PANEL
BUN: 10 mg/dL (ref 6–23)
CO2: 28 mEq/L (ref 19–32)
Calcium: 9.5 mg/dL (ref 8.4–10.5)
Chloride: 105 mEq/L (ref 96–112)
Creatinine, Ser: 0.6 mg/dL (ref 0.4–1.2)

## 2012-11-02 LAB — CBC WITH DIFFERENTIAL/PLATELET
Basophils Absolute: 0 10*3/uL (ref 0.0–0.1)
Basophils Relative: 0.2 % (ref 0.0–3.0)
HCT: 40.3 % (ref 36.0–46.0)
Hemoglobin: 13.6 g/dL (ref 12.0–15.0)
Lymphocytes Relative: 47.2 % — ABNORMAL HIGH (ref 12.0–46.0)
Lymphs Abs: 1.5 10*3/uL (ref 0.7–4.0)
MCHC: 33.6 g/dL (ref 30.0–36.0)
Monocytes Relative: 7.1 % (ref 3.0–12.0)
Neutro Abs: 1.4 10*3/uL (ref 1.4–7.7)
RBC: 4.51 Mil/uL (ref 3.87–5.11)
RDW: 13.9 % (ref 11.5–14.6)

## 2012-11-02 LAB — LIPID PANEL
LDL Cholesterol: 120 mg/dL — ABNORMAL HIGH (ref 0–99)
Total CHOL/HDL Ratio: 3
Triglycerides: 45 mg/dL (ref 0.0–149.0)

## 2012-11-02 LAB — HEPATIC FUNCTION PANEL
Alkaline Phosphatase: 68 U/L (ref 39–117)
Bilirubin, Direct: 0.2 mg/dL (ref 0.0–0.3)
Total Bilirubin: 1.2 mg/dL (ref 0.3–1.2)
Total Protein: 7.8 g/dL (ref 6.0–8.3)

## 2012-11-02 LAB — TSH: TSH: 0.94 u[IU]/mL (ref 0.35–5.50)

## 2012-11-05 NOTE — Progress Notes (Signed)
Quick Note:  Pt has CPE on 11/10/12 will go over then. ______ 

## 2012-11-10 ENCOUNTER — Ambulatory Visit (INDEPENDENT_AMBULATORY_CARE_PROVIDER_SITE_OTHER): Payer: BC Managed Care – PPO | Admitting: Family Medicine

## 2012-11-10 ENCOUNTER — Encounter: Payer: Self-pay | Admitting: Family Medicine

## 2012-11-10 VITALS — BP 110/68 | HR 75 | Temp 98.3°F | Ht 61.0 in | Wt 126.0 lb

## 2012-11-10 DIAGNOSIS — Z23 Encounter for immunization: Secondary | ICD-10-CM

## 2012-11-10 DIAGNOSIS — Z0000 Encounter for general adult medical examination without abnormal findings: Secondary | ICD-10-CM

## 2012-11-10 NOTE — Progress Notes (Signed)
  Subjective:    Patient ID: Kayla Shaw, female    DOB: 1971/03/21, 42 y.o.   MRN: [id number]  HPI 42 yr old female for a cpx. She feels well and has no concerns.    Review of Systems  Constitutional: Negative.   HENT: Negative.   Eyes: Negative.   Respiratory: Negative.   Cardiovascular: Negative.   Gastrointestinal: Negative.   Genitourinary: Negative for dysuria, urgency, frequency, hematuria, flank pain, decreased urine volume, enuresis, difficulty urinating, pelvic pain and dyspareunia.  Musculoskeletal: Negative.   Skin: Negative.   Neurological: Negative.   Hematological: Negative.   Psychiatric/Behavioral: Negative.        Objective:   Physical Exam  Constitutional: She is oriented to person, place, and time. She appears well-developed and well-nourished. No distress.  HENT:  Head: Normocephalic and atraumatic.  Right Ear: External ear normal.  Left Ear: External ear normal.  Nose: Nose normal.  Mouth/Throat: Oropharynx is clear and moist. No oropharyngeal exudate.  Eyes: Conjunctivae normal and EOM are normal. Pupils are equal, round, and reactive to light. No scleral icterus.  Neck: Normal range of motion. Neck supple. No JVD present. No thyromegaly present.  Cardiovascular: Normal rate, regular rhythm, normal heart sounds and intact distal pulses.  Exam reveals no gallop and no friction rub.   No murmur heard. Pulmonary/Chest: Effort normal and breath sounds normal. No respiratory distress. She has no wheezes. She has no rales. She exhibits no tenderness.  Abdominal: Soft. Bowel sounds are normal. She exhibits no distension and no mass. There is no tenderness. There is no rebound and no guarding.  Musculoskeletal: Normal range of motion. She exhibits no edema and no tenderness.  Lymphadenopathy:    She has no cervical adenopathy.  Neurological: She is alert and oriented to person, place, and time. She has normal reflexes. No cranial nerve deficit. She exhibits  normal muscle tone. Coordination normal.  Skin: Skin is warm and dry. No rash noted. No erythema.  Psychiatric: She has a normal mood and affect. Her behavior is normal. Judgment and thought content normal.          Assessment & Plan:  Well exam.

## 2012-11-17 ENCOUNTER — Ambulatory Visit: Payer: BC Managed Care – PPO

## 2012-12-01 ENCOUNTER — Ambulatory Visit
Admission: RE | Admit: 2012-12-01 | Discharge: 2012-12-01 | Disposition: A | Discharge status: Home / Self Care | Payer: BC Managed Care – PPO | Source: Ambulatory Visit | Attending: Obstetrics and Gynecology | Admitting: Obstetrics and Gynecology

## 2012-12-01 DIAGNOSIS — Z1231 Encounter for screening mammogram for malignant neoplasm of breast: Secondary | ICD-10-CM

## 2013-11-03 ENCOUNTER — Other Ambulatory Visit: Payer: Self-pay

## 2013-11-03 DIAGNOSIS — Z1231 Encounter for screening mammogram for malignant neoplasm of breast: Secondary | ICD-10-CM

## 2013-12-02 ENCOUNTER — Ambulatory Visit
Admission: RE | Admit: 2013-12-02 | Discharge: 2013-12-02 | Disposition: A | Discharge status: Home / Self Care | Payer: Self-pay | Source: Ambulatory Visit

## 2013-12-02 DIAGNOSIS — Z1231 Encounter for screening mammogram for malignant neoplasm of breast: Secondary | ICD-10-CM

## 2014-01-26 ENCOUNTER — Encounter: Payer: Self-pay | Admitting: Family

## 2014-01-26 ENCOUNTER — Ambulatory Visit (INDEPENDENT_AMBULATORY_CARE_PROVIDER_SITE_OTHER): Payer: BC Managed Care – PPO | Admitting: Family

## 2014-01-26 VITALS — BP 100/70 | HR 88 | Temp 98.0°F | Wt 124.0 lb

## 2014-01-26 DIAGNOSIS — B9689 Other specified bacterial agents as the cause of diseases classified elsewhere: Secondary | ICD-10-CM

## 2014-01-26 DIAGNOSIS — J309 Allergic rhinitis, unspecified: Secondary | ICD-10-CM

## 2014-01-26 DIAGNOSIS — A499 Bacterial infection, unspecified: Secondary | ICD-10-CM

## 2014-01-26 DIAGNOSIS — J329 Chronic sinusitis, unspecified: Secondary | ICD-10-CM

## 2014-01-26 MED ORDER — AMOXICILLIN 500 MG PO TABS: 1000.0000 mg | ORAL_TABLET | Freq: Two times a day (BID) | ORAL | Status: AC

## 2014-01-26 MED ORDER — GUAIFENESIN-CODEINE 100-10 MG/5ML PO SYRP: 5.0000 mL | ORAL_SOLUTION | Freq: Three times a day (TID) | ORAL | Status: DC | PRN

## 2014-01-26 MED ORDER — FLUTICASONE PROPIONATE 50 MCG/ACT NA SUSP: 2.0000 | Freq: Every day | NASAL | Status: DC

## 2014-01-26 NOTE — Progress Notes (Signed)
Subjective:    Patient ID: Kayla Shaw, female    DOB: 1971-03-27, 43 y.o.   MRN: [id number]  HPI  43 year old African American female, nonsmoker, patient of Dr. Sarajane Jews in today with complaints of sinus pressure, sore throat, pain in her teeth, headache x1 week and worsening. Has not been taking any medication over-the-counter for relief.  Review of Systems  HENT: Positive for congestion, postnasal drip, rhinorrhea, sneezing and sore throat.   Respiratory: Positive for cough.   Cardiovascular: Negative.   Musculoskeletal: Negative.   Skin: Negative.   Allergic/Immunologic: Positive for environmental allergies.  Psychiatric/Behavioral: Negative.    Past Medical History  Diagnosis Date  . Irritable bowel syndrome (IBS)   . GERD (gastroesophageal reflux disease)   . 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 Marital Status: Married    Spouse Name: N/A    Number of Children: N/A  . Years of Education: N/A   Occupational History  . Not on file.   Social History Main Topics  . Smoking status: Never Smoker   . Smokeless tobacco: Never Used  . Alcohol Use: Yes     Comment: rare  . Drug Use: No  . Sexual Activity: Yes   Other Topics Concern  . Not on file   Social History Narrative  . No narrative on file    Past Surgical History  Procedure Laterality Date  . Anal fissure repair  2009    Family History  Problem Relation Age of Onset  . Diabetes Mother   . Hypertension Mother   . Diabetes Father   . Hypertension Father   . Stroke Father   . Anesthesia problems Neg Hx     Allergies  Allergen Reactions  . Sulfamethoxazole-Trimethoprim   . Septra [Sulfamethoxazole-Tmp Ds] Rash and Other (See Comments)    Muscle pain & hot flashes    Current Outpatient Prescriptions on File Prior to Visit  Medication Sig Dispense Refill  . diclofenac (VOLTAREN) 75 MG EC tablet Take 1 tablet (75 mg total) by mouth 2 (two) times daily.  60 tablet   2  . levonorgestrel (MIRENA) 20 MCG/24HR IUD 1 each by Intrauterine route once.      . valACYclovir (VALTREX) 500 MG tablet Take 500 mg by mouth daily.        No current facility-administered medications on file prior to visit.    BP 100/70  Pulse 88  Temp(Src) 98 F (36.7 C) (Oral)  Wt 124 lb (56.246 kg)  SpO2 98%chart    Objective:   Physical Exam  Constitutional: She is oriented to person, place, and time. She appears well-developed and well-nourished.  HENT:  Right Ear: External ear normal.  Left Ear: External ear normal.  Nose: Nose normal.  Mouth/Throat: Oropharynx is clear and moist.  Maxillary sinus tenderness to palpation.   Neck: Normal range of motion. Neck supple.  Cardiovascular: Normal rate, regular rhythm and normal heart sounds.   Pulmonary/Chest: Effort normal and breath sounds normal.  Musculoskeletal: Normal range of motion.  Neurological: She is alert and oriented to person, place, and time.  Skin: Skin is warm and dry.  Psychiatric: She has a normal mood and affect.          Assessment & Plan:  Coriann was seen today for sinusitis.  Diagnoses and associated orders for this visit:  Sinusitis, bacterial  Allergic rhinitis  Other Orders - amoxicillin (AMOXIL) 500 MG tablet;  Take 2 tablets (1,000 mg total) by mouth 2 (two) times daily. - guaiFENesin-codeine (CHERATUSSIN AC) 100-10 MG/5ML syrup; Take 5 mLs by mouth 3 (three) times daily as needed. - fluticasone (FLONASE) 50 MCG/ACT nasal spray; Place 2 sprays into both nostrils daily.   Call the office if symptoms worsen or persist. Recheck as scheduled.

## 2014-01-26 NOTE — Patient Instructions (Signed)

## 2014-02-24 ENCOUNTER — Encounter: Payer: Self-pay | Admitting: Family Medicine

## 2014-02-24 ENCOUNTER — Ambulatory Visit (INDEPENDENT_AMBULATORY_CARE_PROVIDER_SITE_OTHER): Payer: BC Managed Care – PPO | Admitting: Family Medicine

## 2014-02-24 VITALS — BP 111/68 | HR 77 | Temp 98.5°F | Ht 61.0 in | Wt 122.0 lb

## 2014-02-24 DIAGNOSIS — J019 Acute sinusitis, unspecified: Secondary | ICD-10-CM

## 2014-02-24 MED ORDER — LEVOFLOXACIN 500 MG PO TABS: 500.0000 mg | ORAL_TABLET | Freq: Every day | ORAL | Status: AC

## 2014-02-24 NOTE — Progress Notes (Signed)
Pre visit review using our clinic review tool, if applicable. No additional management support is needed unless otherwise documented below in the visit note. 

## 2014-02-24 NOTE — Progress Notes (Signed)
   Subjective:    Patient ID: Kayla Shaw, female    DOB: 1970-12-29, 43 y.o.   MRN: [id number]  HPI Here for recurrent URI sx. She was here last month for a sinus infection and was given Amoxicillin. She improved for awhile but now has the sx again with sinus pressure, PND, hoarseness and a dry cough.    Review of Systems  Constitutional: Negative.   HENT: Positive for congestion, postnasal drip and sinus pressure.   Eyes: Negative.   Respiratory: Positive for cough.        Objective:   Physical Exam  Constitutional: She appears well-developed and well-nourished.  HENT:  Right Ear: External ear normal.  Left Ear: External ear normal.  Nose: Nose normal.  Mouth/Throat: Oropharynx is clear and moist.  Eyes: Conjunctivae are normal.  Cardiovascular: Normal rate, regular rhythm, normal heart sounds and intact distal pulses.   Pulmonary/Chest: Effort normal and breath sounds normal.  Lymphadenopathy:    She has no cervical adenopathy.          Assessment & Plan:  Partially treated sinusitis.

## 2014-08-28 ENCOUNTER — Encounter: Payer: Self-pay | Admitting: Family Medicine

## 2014-09-29 ENCOUNTER — Ambulatory Visit: Payer: BC Managed Care – PPO | Admitting: Family Medicine

## 2014-11-01 ENCOUNTER — Other Ambulatory Visit: Payer: Self-pay

## 2014-11-01 DIAGNOSIS — Z1231 Encounter for screening mammogram for malignant neoplasm of breast: Secondary | ICD-10-CM

## 2014-12-04 ENCOUNTER — Ambulatory Visit
Admission: RE | Admit: 2014-12-04 | Discharge: 2014-12-04 | Disposition: A | Discharge status: Home / Self Care | Payer: BC Managed Care – PPO | Source: Ambulatory Visit

## 2014-12-04 DIAGNOSIS — Z1231 Encounter for screening mammogram for malignant neoplasm of breast: Secondary | ICD-10-CM

## 2014-12-05 ENCOUNTER — Other Ambulatory Visit: Payer: Self-pay | Admitting: Obstetrics and Gynecology

## 2014-12-05 DIAGNOSIS — R928 Other abnormal and inconclusive findings on diagnostic imaging of breast: Secondary | ICD-10-CM

## 2014-12-13 ENCOUNTER — Ambulatory Visit
Admission: RE | Admit: 2014-12-13 | Discharge: 2014-12-13 | Disposition: A | Discharge status: Home / Self Care | Payer: Self-pay | Source: Ambulatory Visit | Attending: Obstetrics and Gynecology | Admitting: Obstetrics and Gynecology

## 2014-12-13 ENCOUNTER — Ambulatory Visit
Admission: RE | Admit: 2014-12-13 | Discharge: 2014-12-13 | Disposition: A | Discharge status: Home / Self Care | Payer: BC Managed Care – PPO | Source: Ambulatory Visit | Attending: Obstetrics and Gynecology | Admitting: Obstetrics and Gynecology

## 2014-12-13 DIAGNOSIS — R928 Other abnormal and inconclusive findings on diagnostic imaging of breast: Secondary | ICD-10-CM

## 2014-12-19 ENCOUNTER — Ambulatory Visit (INDEPENDENT_AMBULATORY_CARE_PROVIDER_SITE_OTHER): Payer: BC Managed Care – PPO | Admitting: Internal Medicine

## 2014-12-19 ENCOUNTER — Encounter: Payer: Self-pay | Admitting: Internal Medicine

## 2014-12-19 ENCOUNTER — Ambulatory Visit: Payer: BC Managed Care – PPO | Admitting: Family Medicine

## 2014-12-19 VITALS — BP 104/58 | HR 98 | Temp 98.9°F | Wt 120.8 lb

## 2014-12-19 DIAGNOSIS — J3089 Other allergic rhinitis: Secondary | ICD-10-CM

## 2014-12-19 DIAGNOSIS — J069 Acute upper respiratory infection, unspecified: Secondary | ICD-10-CM

## 2014-12-19 DIAGNOSIS — J019 Acute sinusitis, unspecified: Secondary | ICD-10-CM

## 2014-12-19 MED ORDER — AMOXICILLIN-POT CLAVULANATE 875-125 MG PO TABS: 1.0000 | ORAL_TABLET | Freq: Two times a day (BID) | ORAL | Status: DC

## 2014-12-19 MED ORDER — HYDROCODONE-HOMATROPINE 5-1.5 MG/5ML PO SYRP: 5.0000 mL | ORAL_SOLUTION | ORAL | Status: DC | PRN

## 2014-12-19 NOTE — Progress Notes (Signed)
Pre visit review using our clinic review tool, if applicable. No additional management support is needed unless otherwise documented below in the visit note.  Chief Complaint  Patient presents with  . Nasal Congestion  . Cough  . Sore Throat  . Dizziness    HPI: Patient Kayla Shaw  comes in today for SDA for  new problem evaluation. pcp NA.  Onset 5 days ago sinus congestion   Like a bad sinus infection  No pain in face "yet "  remember wo bad last time was  No fever  Cough bad.   No meds tried except  benadryl sinus  Last night    Feel dizzy today and very congested and med no help with stuffiness Not quite seasonal  . Fall November. Had sinus issue but resolved on its own.  Not yet face pain.  ROS: See pertinent positives and negatives per HPI.  Past Medical History  Diagnosis Date  . Irritable bowel syndrome (IBS)   . GERD (gastroesophageal reflux disease)   . 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 Diabetes Mother   . Hypertension Mother   . Diabetes Father   . Hypertension Father   . Stroke Father   . Anesthesia problems Neg Hx     History   Social History  . Marital Status: Married    Spouse Name: N/A  . Number of Children: N/A  . Years of Education: N/A   Social History Main Topics  . Smoking status: Never Smoker   . Smokeless tobacco: Never Used  . Alcohol Use: Yes     Comment: occ  . Drug Use: No  . Sexual Activity: Yes   Other Topics Concern  . Not on file   Social History Narrative    Outpatient Encounter Prescriptions as of 12/19/2014  Medication Sig  . levonorgestrel (MIRENA) 20 MCG/24HR IUD 1 each by Intrauterine route once.  . valACYclovir (VALTREX) 500 MG tablet Take 500 mg by mouth daily.   Marland Kitchen amoxicillin-clavulanate (AUGMENTIN) 875-125 MG per tablet Take 1 tablet by mouth every 12 (twelve) hours. for sinusitis  . diclofenac (VOLTAREN) 75 MG EC tablet Take 1 tablet (75 mg total) by  mouth 2 (two) times daily. (Patient not taking: Reported on 12/19/2014)  . fluticasone (FLONASE) 50 MCG/ACT nasal spray Place 2 sprays into both nostrils daily. (Patient not taking: Reported on 12/19/2014)  . HYDROcodone-homatropine (HYCODAN) 5-1.5 MG/5ML syrup Take 5 mLs by mouth every 4 (four) hours as needed for cough (at night).  . [DISCONTINUED] guaiFENesin-codeine (CHERATUSSIN AC) 100-10 MG/5ML syrup Take 5 mLs by mouth 3 (three) times daily as needed.    EXAM:  BP 104/58 mmHg  Pulse 98  Temp(Src) 98.9 F (37.2 C) (Oral)  Wt 120 lb 12.8 oz (54.795 kg)  SpO2 99%  Body mass index is 22.84 kg/(m^2). WDWN in NAD  quiet respirations; very  congested  somewhat hoarse. Non toxic . HEENT: Normocephalic ;atraumatic , Eyes;  PERRL, EOMs  Full, lids and conjunctiva clear,,Ears: no deformities, canals nl, TM landmarks normal, Nose: no deformity or discharge but congested;face minimally tender Mouth : OP clear without lesion or edema . Neck: Supple without adenopathy or masses or bruits Chest:  Clear to A&P without wheezes rales or rhonchi CV:  S1-S2 no gallops or murmurs peripheral perfusion is normal Skin :nl perfusion and no acute rashes  MS: moves all extremities without noticeable focal  abnormality PSYCH: pleasant and cooperative, no obvious depression or anxiety  ASSESSMENT AND PLAN:  Discussed the following assessment and plan:  Acute sinusitis, recurrence not specified, unspecified location - poss viral plus allergic  not respinseive to deongestants so far hx of sinus infection neeeding antibiotic to resolve   Other allergic rhinitis - poss adding to underlying congestion  URI, acute  -Patient advised to return or notify health care team  if symptoms worsen ,persist or new concerns arise.  Patient Instructions  Most sinu sinfection resolve on own with decongestants saline  But add flonase or nasacort nose spray every day  If needed can use afrin nose spry for no more than 3  days in a row.   If face pain and persistent  Add antibiotic .  Sinusitis Sinusitis is redness, soreness, and inflammation of the paranasal sinuses. Paranasal sinuses are air pockets within the bones of your face (beneath the eyes, the middle of the forehead, or above the eyes). In healthy paranasal sinuses, mucus is able to drain out, and air is able to circulate through them by way of your nose. However, when your paranasal sinuses are inflamed, mucus and air can become trapped. This can allow bacteria and other germs to grow and cause infection. Sinusitis can develop quickly and last only a short time (acute) or continue over a long period (chronic). Sinusitis that lasts for more than 12 weeks is considered chronic.  CAUSES  Causes of sinusitis include:  Allergies.  Structural abnormalities, such as displacement of the cartilage that separates your nostrils (deviated septum), which can decrease the air flow through your nose and sinuses and affect sinus drainage.  Functional abnormalities, such as when the small hairs (cilia) that line your sinuses and help remove mucus do not work properly or are not present. SIGNS AND SYMPTOMS  Symptoms of acute and chronic sinusitis are the same. The primary symptoms are pain and pressure around the affected sinuses. Other symptoms include:  Upper toothache.  Earache.  Headache.  Bad breath.  Decreased sense of smell and taste.  A cough, which worsens when you are lying flat.  Fatigue.  Fever.  Thick drainage from your nose, which often is green and may contain pus (purulent).  Swelling and warmth over the affected sinuses. DIAGNOSIS  Your health care provider will perform a physical exam. During the exam, your health care provider may:  Look in your nose for signs of abnormal growths in your nostrils (nasal polyps).  Tap over the affected sinus to check for signs of infection.  View the inside of your sinuses (endoscopy) using an  imaging device that has a light attached (endoscope). If your health care provider suspects that you have chronic sinusitis, one or more of the following tests may be recommended:  Allergy tests.  Nasal culture. A sample of mucus is taken from your nose, sent to a lab, and screened for bacteria.  Nasal cytology. A sample of mucus is taken from your nose and examined by your health care provider to determine if your sinusitis is related to an allergy. TREATMENT  Most cases of acute sinusitis are related to a viral infection and will resolve on their own within 10 days. Sometimes medicines are prescribed to help relieve symptoms (pain medicine, decongestants, nasal steroid sprays, or saline sprays).  However, for sinusitis related to a bacterial infection, your health care provider will prescribe antibiotic medicines. These are medicines that will help kill the bacteria causing the infection.  Rarely,  sinusitis is caused by a fungal infection. In theses cases, your health care provider will prescribe antifungal medicine. For some cases of chronic sinusitis, surgery is needed. Generally, these are cases in which sinusitis recurs more than 3 times per year, despite other treatments. HOME CARE INSTRUCTIONS   Drink plenty of water. Water helps thin the mucus so your sinuses can drain more easily.  Use a humidifier.  Inhale steam 3 to 4 times a day (for example, sit in the bathroom with the shower running).  Apply a warm, moist washcloth to your face 3 to 4 times a day, or as directed by your health care provider.  Use saline nasal sprays to help moisten and clean your sinuses.  Take medicines only as directed by your health care provider.  If you were prescribed either an antibiotic or antifungal medicine, finish it all even if you start to feel better. SEEK IMMEDIATE MEDICAL CARE IF:  You have increasing pain or severe headaches.  You have nausea, vomiting, or drowsiness.  You have  swelling around your face.  You have vision problems.  You have a stiff neck.  You have difficulty breathing. MAKE SURE YOU:   Understand these instructions.  Will watch your condition.  Will get help right away if you are not doing well or get worse. Document Released: 10/13/2005 Document Revised: 02/27/2014 Document Reviewed: 10/28/2011 [facility] Patient Information 2015 [place], [place]. This information is not intended to replace advice given to you by your health care provider. Make sure you discuss any questions you have with your health care provider.      Standley Brooking. Kayla Shaw M.D.

## 2014-12-19 NOTE — Patient Instructions (Signed)
Most sinu sinfection resolve on own with decongestants saline  But add flonase or nasacort nose spray every day  If needed can use afrin nose spry for no more than 3 days in a row.   If face pain and persistent  Add antibiotic .  Sinusitis Sinusitis is redness, soreness, and inflammation of the paranasal sinuses. Paranasal sinuses are air pockets within the bones of your face (beneath the eyes, the middle of the forehead, or above the eyes). In healthy paranasal sinuses, mucus is able to drain out, and air is able to circulate through them by way of your nose. However, when your paranasal sinuses are inflamed, mucus and air can become trapped. This can allow bacteria and other germs to grow and cause infection. Sinusitis can develop quickly and last only a short time (acute) or continue over a long period (chronic). Sinusitis that lasts for more than 12 weeks is considered chronic.  CAUSES  Causes of sinusitis include:  Allergies.  Structural abnormalities, such as displacement of the cartilage that separates your nostrils (deviated septum), which can decrease the air flow through your nose and sinuses and affect sinus drainage.  Functional abnormalities, such as when the small hairs (cilia) that line your sinuses and help remove mucus do not work properly or are not present. SIGNS AND SYMPTOMS  Symptoms of acute and chronic sinusitis are the same. The primary symptoms are pain and pressure around the affected sinuses. Other symptoms include:  Upper toothache.  Earache.  Headache.  Bad breath.  Decreased sense of smell and taste.  A cough, which worsens when you are lying flat.  Fatigue.  Fever.  Thick drainage from your nose, which often is green and may contain pus (purulent).  Swelling and warmth over the affected sinuses. DIAGNOSIS  Your health care provider will perform a physical exam. During the exam, your health care provider may:  Look in your nose for signs of  abnormal growths in your nostrils (nasal polyps).  Tap over the affected sinus to check for signs of infection.  View the inside of your sinuses (endoscopy) using an imaging device that has a light attached (endoscope). If your health care provider suspects that you have chronic sinusitis, one or more of the following tests may be recommended:  Allergy tests.  Nasal culture. A sample of mucus is taken from your nose, sent to a lab, and screened for bacteria.  Nasal cytology. A sample of mucus is taken from your nose and examined by your health care provider to determine if your sinusitis is related to an allergy. TREATMENT  Most cases of acute sinusitis are related to a viral infection and will resolve on their own within 10 days. Sometimes medicines are prescribed to help relieve symptoms (pain medicine, decongestants, nasal steroid sprays, or saline sprays).  However, for sinusitis related to a bacterial infection, your health care provider will prescribe antibiotic medicines. These are medicines that will help kill the bacteria causing the infection.  Rarely, sinusitis is caused by a fungal infection. In theses cases, your health care provider will prescribe antifungal medicine. For some cases of chronic sinusitis, surgery is needed. Generally, these are cases in which sinusitis recurs more than 3 times per year, despite other treatments. HOME CARE INSTRUCTIONS   Drink plenty of water. Water helps thin the mucus so your sinuses can drain more easily.  Use a humidifier.  Inhale steam 3 to 4 times a day (for example, sit in the bathroom with the shower  running).  Apply a warm, moist washcloth to your face 3 to 4 times a day, or as directed by your health care provider.  Use saline nasal sprays to help moisten and clean your sinuses.  Take medicines only as directed by your health care provider.  If you were prescribed either an antibiotic or antifungal medicine, finish it all even if  you start to feel better. SEEK IMMEDIATE MEDICAL CARE IF:  You have increasing pain or severe headaches.  You have nausea, vomiting, or drowsiness.  You have swelling around your face.  You have vision problems.  You have a stiff neck.  You have difficulty breathing. MAKE SURE YOU:   Understand these instructions.  Will watch your condition.  Will get help right away if you are not doing well or get worse. Document Released: 10/13/2005 Document Revised: 02/27/2014 Document Reviewed: 10/28/2011 [facility] Patient Information 2015 [place], [place]. This information is not intended to replace advice given to you by your health care provider. Make sure you discuss any questions you have with your health care provider.

## 2015-01-31 ENCOUNTER — Other Ambulatory Visit (INDEPENDENT_AMBULATORY_CARE_PROVIDER_SITE_OTHER): Payer: BC Managed Care – PPO

## 2015-01-31 DIAGNOSIS — Z0000 Encounter for general adult medical examination without abnormal findings: Secondary | ICD-10-CM

## 2015-01-31 LAB — BASIC METABOLIC PANEL
BUN: 16 mg/dL (ref 6–23)
CHLORIDE: 104 meq/L (ref 96–112)
CO2: 24 mEq/L (ref 19–32)
CREATININE: 0.78 mg/dL (ref 0.40–1.20)
Calcium: 10.1 mg/dL (ref 8.4–10.5)
GFR: 103.35 mL/min (ref >60.00)
Glucose, Bld: 92 mg/dL (ref 70–99)
POTASSIUM: 3.7 meq/L (ref 3.5–5.1)
SODIUM: 136 meq/L (ref 135–145)

## 2015-01-31 LAB — LIPID PANEL
Cholesterol: 199 mg/dL (ref 0–200)
HDL: 59 mg/dL (ref >39.00)
LDL Cholesterol: 132 mg/dL — ABNORMAL HIGH (ref 0–99)
NonHDL: 140
Total CHOL/HDL Ratio: 3
Triglycerides: 40 mg/dL (ref 0.0–149.0)
VLDL: 8 mg/dL (ref 0.0–40.0)

## 2015-01-31 LAB — HEPATIC FUNCTION PANEL
ALK PHOS: 58 U/L (ref 39–117)
ALT: 14 U/L (ref 0–35)
AST: 11 U/L (ref 0–37)
Albumin: 4.5 g/dL (ref 3.5–5.2)
BILIRUBIN TOTAL: 0.4 mg/dL (ref 0.2–1.2)
Bilirubin, Direct: 0 mg/dL (ref 0.0–0.3)
Total Protein: 8.1 g/dL (ref 6.0–8.3)

## 2015-01-31 LAB — CBC WITH DIFFERENTIAL/PLATELET
Basophils Absolute: 0 10*3/uL (ref 0.0–0.1)
Basophils Relative: 0.3 % (ref 0.0–3.0)
Eosinophils Absolute: 0.1 10*3/uL (ref 0.0–0.7)
Eosinophils Relative: 1.3 % (ref 0.0–5.0)
HEMATOCRIT: 42 % (ref 36.0–46.0)
HEMOGLOBIN: 14.1 g/dL (ref 12.0–15.0)
LYMPHS ABS: 1.6 10*3/uL (ref 0.7–4.0)
Lymphocytes Relative: 25.9 % (ref 12.0–46.0)
MCHC: 33.7 g/dL (ref 30.0–36.0)
MCV: 90.6 fl (ref 78.0–100.0)
Monocytes Absolute: 0.4 10*3/uL (ref 0.1–1.0)
Monocytes Relative: 6.7 % (ref 3.0–12.0)
NEUTROS ABS: 4.2 10*3/uL (ref 1.4–7.7)
Neutrophils Relative %: 65.8 % (ref 43.0–77.0)
Platelets: 215 10*3/uL (ref 150.0–400.0)
RBC: 4.63 Mil/uL (ref 3.87–5.11)
RDW: 13.5 % (ref 11.5–15.5)
WBC: 6.4 10*3/uL (ref 4.0–10.5)

## 2015-01-31 LAB — POCT URINALYSIS DIPSTICK
Bilirubin, UA: NEGATIVE
Glucose, UA: NEGATIVE
Ketones, UA: NEGATIVE
NITRITE UA: NEGATIVE
Protein, UA: NEGATIVE
Spec Grav, UA: 1.025
UROBILINOGEN UA: 0.2
pH, UA: 5.5

## 2015-01-31 LAB — TSH: TSH: 0.88 u[IU]/mL (ref 0.35–4.50)

## 2015-02-05 ENCOUNTER — Ambulatory Visit (INDEPENDENT_AMBULATORY_CARE_PROVIDER_SITE_OTHER): Payer: BC Managed Care – PPO | Admitting: Family Medicine

## 2015-02-05 ENCOUNTER — Encounter: Payer: Self-pay | Admitting: Family Medicine

## 2015-02-05 VITALS — BP 96/60 | HR 86 | Temp 98.5°F | Ht 61.0 in | Wt 123.0 lb

## 2015-02-05 DIAGNOSIS — Z0000 Encounter for general adult medical examination without abnormal findings: Secondary | ICD-10-CM

## 2015-02-05 NOTE — Progress Notes (Signed)
   Subjective:    Patient ID: Kayla Shaw, female    DOB: [birth year], 44 y.o.   MRN: [id number]  HPI 44 yr old female for a cpx. She feels fine.    Review of Systems  Constitutional: Negative.   HENT: Negative.   Eyes: Negative.   Respiratory: Negative.   Cardiovascular: Negative.   Gastrointestinal: Negative.   Genitourinary: Negative for dysuria, urgency, frequency, hematuria, flank pain, decreased urine volume, enuresis, difficulty urinating, pelvic pain and dyspareunia.  Musculoskeletal: Negative.   Skin: Negative.   Neurological: Negative.   Psychiatric/Behavioral: Negative.        Objective:   Physical Exam  Constitutional: She is oriented to person, place, and time. She appears well-developed and well-nourished. No distress.  HENT:  Head: Normocephalic and atraumatic.  Right Ear: External ear normal.  Left Ear: External ear normal.  Nose: Nose normal.  Mouth/Throat: Oropharynx is clear and moist. No oropharyngeal exudate.  Eyes: Conjunctivae and EOM are normal. Pupils are equal, round, and reactive to light. No scleral icterus.  Neck: Normal range of motion. Neck supple. No JVD present. No thyromegaly present.  Cardiovascular: Normal rate, regular rhythm, normal heart sounds and intact distal pulses.  Exam reveals no gallop and no friction rub.   No murmur heard. Pulmonary/Chest: Effort normal and breath sounds normal. No respiratory distress. She has no wheezes. She has no rales. She exhibits no tenderness.  Abdominal: Soft. Bowel sounds are normal. She exhibits no distension and no mass. There is no tenderness. There is no rebound and no guarding.  Musculoskeletal: Normal range of motion. She exhibits no edema or tenderness.  Lymphadenopathy:    She has no cervical adenopathy.  Neurological: She is alert and oriented to person, place, and time. She has normal reflexes. No cranial nerve deficit. She exhibits normal muscle tone. Coordination normal.  Skin: Skin is  warm and dry. No rash noted. No erythema.  Psychiatric: She has a normal mood and affect. Her behavior is normal. Judgment and thought content normal.          Assessment & Plan:  Well exam.

## 2015-02-05 NOTE — Progress Notes (Signed)
Pre visit review using our clinic review tool, if applicable. No additional management support is needed unless otherwise documented below in the visit note. 

## 2015-02-07 NOTE — Progress Notes (Signed)
   Subjective:    Patient ID: Kayla Shaw, female    DOB: 1971/06/25, 43 y.o.   MRN: [id number]  HPI    Review of Systems     Objective:   Physical Exam        Assessment & Plan:  She already sees GYN yearly and gest mammograms. Her immunizations are up to date. We discussed a healthy diet and getting exercise.

## 2015-07-18 ENCOUNTER — Ambulatory Visit (INDEPENDENT_AMBULATORY_CARE_PROVIDER_SITE_OTHER): Payer: BC Managed Care – PPO | Admitting: Family Medicine

## 2015-07-18 ENCOUNTER — Encounter: Payer: Self-pay | Admitting: Family Medicine

## 2015-07-18 VITALS — BP 106/73 | HR 71 | Temp 98.4°F | Ht 61.0 in | Wt 123.0 lb

## 2015-07-18 DIAGNOSIS — M4120 Other idiopathic scoliosis, site unspecified: Secondary | ICD-10-CM

## 2015-07-18 MED ORDER — MELOXICAM 15 MG PO TABS: 15.0000 mg | ORAL_TABLET | Freq: Every day | ORAL | Status: DC

## 2015-07-18 NOTE — Progress Notes (Signed)
Pre visit review using our clinic review tool, if applicable. No additional management support is needed unless otherwise documented below in the visit note. 

## 2015-07-18 NOTE — Progress Notes (Signed)
   Subjective:    Patient ID: Kayla Shaw, female    DOB: July 31, 1971, 44 y.o.   MRN: [id number]  HPI Here for frequent stiffness and pain in the back, primarily in the middle of the back. No hx of trauma, but she was told as a teenager that she had a mild curve to her spine. She is not working out at the present, but she does carry her 32 year old child around a lot. Advil helps a little.   Review of Systems  Constitutional: Negative.   Musculoskeletal: Positive for back pain. Negative for joint swelling, arthralgias, gait problem, neck pain and neck stiffness.       Objective:   Physical Exam  Constitutional: She appears well-developed and well-nourished. No distress.  Musculoskeletal:  Her thoracic spine has slight lateral and rotational scoliosis, not tender, full ROM          Assessment & Plan:  Her back pain seems to stem from a mild degree of scoliosis. She will try Meloxicam 15 mg daily and I suggested she try yoga to increase the strength and flexibility in her core muscles.

## 2015-10-11 ENCOUNTER — Encounter: Payer: Self-pay | Admitting: Family Medicine

## 2015-10-11 ENCOUNTER — Ambulatory Visit (INDEPENDENT_AMBULATORY_CARE_PROVIDER_SITE_OTHER): Payer: BC Managed Care – PPO | Admitting: Family Medicine

## 2015-10-11 VITALS — BP 102/65 | HR 78 | Temp 98.4°F | Ht 61.0 in | Wt 124.0 lb

## 2015-10-11 DIAGNOSIS — J019 Acute sinusitis, unspecified: Secondary | ICD-10-CM | POA: Diagnosis not present

## 2015-10-11 MED ORDER — HYDROCODONE-HOMATROPINE 5-1.5 MG/5ML PO SYRP: 5.0000 mL | ORAL_SOLUTION | ORAL | Status: DC | PRN

## 2015-10-11 MED ORDER — AZITHROMYCIN 250 MG PO TABS: ORAL_TABLET | ORAL | Status: DC

## 2015-10-11 NOTE — Progress Notes (Signed)
Pre visit review using our clinic review tool, if applicable. No additional management support is needed unless otherwise documented below in the visit note. 

## 2015-10-11 NOTE — Progress Notes (Signed)
   Subjective:    Patient ID: Kayla Shaw, female    DOB: 1971-09-12, 44 y.o.   MRN: [id number]  HPI Here for 5 days of sinus pressure, PND, and coughing up yellow sputum. No fever.    Review of Systems  Constitutional: Negative.   HENT: Positive for congestion, postnasal drip and sinus pressure. Negative for ear pain and sore throat.   Eyes: Negative.   Respiratory: Positive for cough.        Objective:   Physical Exam  Constitutional: She appears well-developed and well-nourished.  HENT:  Right Ear: External ear normal.  Left Ear: External ear normal.  Nose: Nose normal.  Mouth/Throat: Oropharynx is clear and moist.  Eyes: Conjunctivae are normal.  Neck: No thyromegaly present.  Pulmonary/Chest: Effort normal and breath sounds normal.  Lymphadenopathy:    She has no cervical adenopathy.          Assessment & Plan:  Sinusitis, treat with a Zpack

## 2015-10-15 ENCOUNTER — Telehealth: Payer: Self-pay | Admitting: Family Medicine

## 2015-10-15 MED ORDER — AMOXICILLIN-POT CLAVULANATE 875-125 MG PO TABS: 1.0000 | ORAL_TABLET | Freq: Two times a day (BID) | ORAL | Status: DC

## 2015-10-15 NOTE — Telephone Encounter (Signed)
Call in Augmentin 875 bid for 10 days  

## 2015-10-15 NOTE — Telephone Encounter (Signed)
Kayla Shaw called saying she was here on last Thurs due to a sinus infection and was prescribed a Zpack. She took it in full and still has no relief of symptoms. She's still extremely congested, etc. She's wondering if a stronger medication can be called in for her. Please give her a call.  Pt's ph# [phone] Thank you.

## 2015-10-15 NOTE — Telephone Encounter (Signed)
Rx was sent to the pharmacy  

## 2015-11-28 ENCOUNTER — Other Ambulatory Visit: Payer: Self-pay

## 2015-11-28 DIAGNOSIS — Z1231 Encounter for screening mammogram for malignant neoplasm of breast: Secondary | ICD-10-CM

## 2016-01-01 ENCOUNTER — Ambulatory Visit: Payer: BC Managed Care – PPO

## 2016-01-04 ENCOUNTER — Ambulatory Visit (INDEPENDENT_AMBULATORY_CARE_PROVIDER_SITE_OTHER): Payer: BC Managed Care – PPO | Admitting: Family Medicine

## 2016-01-04 ENCOUNTER — Encounter: Payer: Self-pay | Admitting: Family Medicine

## 2016-01-04 VITALS — BP 112/67 | HR 94 | Temp 98.9°F | Ht 61.0 in | Wt 126.0 lb

## 2016-01-04 DIAGNOSIS — J40 Bronchitis, not specified as acute or chronic: Secondary | ICD-10-CM

## 2016-01-04 MED ORDER — HYDROCODONE-HOMATROPINE 5-1.5 MG/5ML PO SYRP: 5.0000 mL | ORAL_SOLUTION | ORAL | Status: DC | PRN

## 2016-01-04 MED ORDER — FLUCONAZOLE 150 MG PO TABS: 150.0000 mg | ORAL_TABLET | Freq: Once | ORAL | Status: DC

## 2016-01-04 MED ORDER — AMOXICILLIN-POT CLAVULANATE 875-125 MG PO TABS: 1.0000 | ORAL_TABLET | Freq: Two times a day (BID) | ORAL | Status: DC

## 2016-01-04 NOTE — Progress Notes (Signed)
   Subjective:    Patient ID: Kayla Shaw, female    DOB: 1971/03/08, 45 y.o.   MRN: [id number]  HPI Here for 4 days of chest tightness and coughing up green sputum. No fever. Using Robitussin. Of note she took 7 days of Levaquin for a bronchitis one month ago after an urgent care visit. Her symptoms never quite resolved after that.   Review of Systems  Constitutional: Negative.   HENT: Negative.   Eyes: Negative.   Respiratory: Positive for cough and chest tightness. Negative for shortness of breath and wheezing.        Objective:   Physical Exam  Constitutional: She appears well-developed and well-nourished.  HENT:  Right Ear: External ear normal.  Left Ear: External ear normal.  Nose: Nose normal.  Mouth/Throat: Oropharynx is clear and moist.  Eyes: Conjunctivae are normal.  Neck: No thyromegaly present.  Pulmonary/Chest: Effort normal. No respiratory distress. She has no rales.  Scattered rhonchi and wheezes   Lymphadenopathy:    She has no cervical adenopathy.          Assessment & Plan:  Recurrent bronchitis, treat with Augmentin.

## 2016-01-04 NOTE — Progress Notes (Signed)
Pre visit review using our clinic review tool, if applicable. No additional management support is needed unless otherwise documented below in the visit note. 

## 2016-01-30 ENCOUNTER — Ambulatory Visit
Admission: RE | Admit: 2016-01-30 | Discharge: 2016-01-30 | Disposition: A | Discharge status: Home / Self Care | Payer: BC Managed Care – PPO | Source: Ambulatory Visit

## 2016-01-30 DIAGNOSIS — Z1231 Encounter for screening mammogram for malignant neoplasm of breast: Secondary | ICD-10-CM

## 2016-09-04 ENCOUNTER — Encounter: Payer: Self-pay | Admitting: Family Medicine

## 2016-09-04 ENCOUNTER — Ambulatory Visit (INDEPENDENT_AMBULATORY_CARE_PROVIDER_SITE_OTHER): Payer: BC Managed Care – PPO | Admitting: Family Medicine

## 2016-09-04 VITALS — BP 96/69 | HR 89 | Temp 98.3°F | Ht 61.0 in | Wt 126.0 lb

## 2016-09-04 DIAGNOSIS — J019 Acute sinusitis, unspecified: Secondary | ICD-10-CM

## 2016-09-04 MED ORDER — AMOXICILLIN-POT CLAVULANATE 875-125 MG PO TABS: 1.0000 | ORAL_TABLET | Freq: Two times a day (BID) | ORAL | 0 refills | Status: DC

## 2016-09-04 MED ORDER — FLUCONAZOLE 150 MG PO TABS: 150.0000 mg | ORAL_TABLET | Freq: Once | ORAL | 5 refills | Status: AC

## 2016-09-04 NOTE — Progress Notes (Signed)
Pre visit review using our clinic review tool, if applicable. No additional management support is needed unless otherwise documented below in the visit note. 

## 2016-09-04 NOTE — Progress Notes (Signed)
   Subjective:    Patient ID: Kayla Shaw, female    DOB: 27-Jun-1971, 45 y.o.   MRN: [id number]  HPI Here for 2 weeks of sinus pressure, PND, and coughing up green sputum. No fever.    Review of Systems  Constitutional: Negative.   HENT: Positive for congestion, postnasal drip, sinus pain and sinus pressure. Negative for sore throat.   Eyes: Negative.   Respiratory: Positive for cough.        Objective:   Physical Exam  Constitutional: She appears well-developed and well-nourished.  HENT:  Right Ear: External ear normal.  Left Ear: External ear normal.  Nose: Nose normal.  Mouth/Throat: Oropharynx is clear and moist.  Eyes: Conjunctivae are normal.  Neck: No thyromegaly present.  Pulmonary/Chest: Effort normal and breath sounds normal.  Lymphadenopathy:    She has no cervical adenopathy.          Assessment & Plan:  Sinusitis, treat with Augmentin.  Laurey Morale, MD

## 2016-12-08 ENCOUNTER — Ambulatory Visit (INDEPENDENT_AMBULATORY_CARE_PROVIDER_SITE_OTHER): Payer: BC Managed Care – PPO | Admitting: Family Medicine

## 2016-12-08 ENCOUNTER — Encounter: Payer: Self-pay | Admitting: Family Medicine

## 2016-12-08 VITALS — BP 100/70 | HR 92 | Temp 99.6°F | Ht 61.0 in | Wt 127.0 lb

## 2016-12-08 DIAGNOSIS — J111 Influenza due to unidentified influenza virus with other respiratory manifestations: Secondary | ICD-10-CM | POA: Diagnosis not present

## 2016-12-08 MED ORDER — OSELTAMIVIR PHOSPHATE 75 MG PO CAPS: 75.0000 mg | ORAL_CAPSULE | Freq: Two times a day (BID) | ORAL | 0 refills | Status: DC

## 2016-12-08 NOTE — Progress Notes (Signed)
   Subjective:    Patient ID: Altamease Oiler, female    DOB: May 24, 1971, 46 y.o.   MRN: [id number]  HPI Here with 2 days of body aches, low garde fevers, headache and a dry cough. No NVD. Her son was diagnosed with influenza over the weekend at urgent care and is being treated with Tamiflu.    Review of Systems  Constitutional: Positive for fever.  HENT: Positive for congestion. Negative for sinus pain, sinus pressure and sore throat.   Eyes: Negative.   Respiratory: Positive for cough.   Gastrointestinal: Negative.   Musculoskeletal: Positive for myalgias.       Objective:   Physical Exam  Constitutional: She appears well-developed and well-nourished.  HENT:  Right Ear: External ear normal.  Left Ear: External ear normal.  Nose: Nose normal.  Mouth/Throat: Oropharynx is clear and moist.  Eyes: Conjunctivae are normal.  Neck: No thyromegaly present.  Pulmonary/Chest: Effort normal and breath sounds normal.  Lymphadenopathy:    She has no cervical adenopathy.          Assessment & Plan:  Probable influenza. Treat with Tamiflu, Advil, and fluids.  Alysia Penna, MD

## 2016-12-08 NOTE — Progress Notes (Signed)
Pre visit review using our clinic review tool, if applicable. No additional management support is needed unless otherwise documented below in the visit note. 

## 2017-02-24 ENCOUNTER — Other Ambulatory Visit: Payer: Self-pay | Admitting: Obstetrics and Gynecology

## 2017-02-24 DIAGNOSIS — Z1231 Encounter for screening mammogram for malignant neoplasm of breast: Secondary | ICD-10-CM

## 2017-03-30 ENCOUNTER — Ambulatory Visit
Admission: RE | Admit: 2017-03-30 | Discharge: 2017-03-30 | Disposition: A | Discharge status: Home / Self Care | Payer: BC Managed Care – PPO | Source: Ambulatory Visit | Attending: Obstetrics and Gynecology | Admitting: Obstetrics and Gynecology

## 2017-03-30 DIAGNOSIS — Z1231 Encounter for screening mammogram for malignant neoplasm of breast: Secondary | ICD-10-CM

## 2018-01-26 ENCOUNTER — Ambulatory Visit: Payer: BC Managed Care – PPO | Admitting: Family Medicine

## 2018-01-26 ENCOUNTER — Encounter: Payer: Self-pay | Admitting: Family Medicine

## 2018-01-26 VITALS — BP 114/68 | HR 78 | Temp 98.6°F | Ht 61.0 in | Wt 131.2 lb

## 2018-01-26 DIAGNOSIS — J019 Acute sinusitis, unspecified: Secondary | ICD-10-CM | POA: Diagnosis not present

## 2018-01-26 MED ORDER — HYDROCODONE-HOMATROPINE 5-1.5 MG/5ML PO SYRP: 5.0000 mL | ORAL_SOLUTION | ORAL | 0 refills | Status: DC | PRN

## 2018-01-26 MED ORDER — AMOXICILLIN-POT CLAVULANATE 875-125 MG PO TABS: 1.0000 | ORAL_TABLET | Freq: Two times a day (BID) | ORAL | 0 refills | Status: DC

## 2018-01-26 NOTE — Progress Notes (Signed)
   Subjective:    Patient ID: Kayla Shaw, female    DOB: 1971-08-08, 47 y.o.   MRN: [id number]  HPI Here for 5 days of sinus congestion, PND, ST, and coughing up green sputum.    Review of Systems  Constitutional: Negative.   HENT: Positive for congestion, postnasal drip, sinus pressure and sore throat. Negative for sinus pain.   Eyes: Negative.   Respiratory: Positive for cough.        Objective:   Physical Exam  Constitutional: She appears well-developed and well-nourished.  HENT:  Right Ear: External ear normal.  Left Ear: External ear normal.  Nose: Nose normal.  Mouth/Throat: Oropharynx is clear and moist.  Eyes: Conjunctivae are normal.  Neck: No thyromegaly present.  Pulmonary/Chest: Effort normal and breath sounds normal. No respiratory distress. She has no wheezes. She has no rales.  Lymphadenopathy:    She has no cervical adenopathy.          Assessment & Plan:  Sinusitis, treat with Augmentin. Alysia Penna, MD

## 2018-05-06 ENCOUNTER — Other Ambulatory Visit: Payer: Self-pay | Admitting: Obstetrics and Gynecology

## 2018-05-06 DIAGNOSIS — Z1231 Encounter for screening mammogram for malignant neoplasm of breast: Secondary | ICD-10-CM

## 2018-05-07 ENCOUNTER — Ambulatory Visit
Admission: RE | Admit: 2018-05-07 | Discharge: 2018-05-07 | Disposition: A | Discharge status: Home / Self Care | Payer: BC Managed Care – PPO | Source: Ambulatory Visit | Attending: Obstetrics and Gynecology | Admitting: Obstetrics and Gynecology

## 2018-05-07 DIAGNOSIS — Z1231 Encounter for screening mammogram for malignant neoplasm of breast: Secondary | ICD-10-CM

## 2018-11-04 ENCOUNTER — Encounter: Payer: Self-pay | Admitting: Family Medicine

## 2018-11-04 ENCOUNTER — Ambulatory Visit (INDEPENDENT_AMBULATORY_CARE_PROVIDER_SITE_OTHER): Payer: BC Managed Care – PPO | Admitting: Family Medicine

## 2018-11-04 VITALS — BP 112/68 | HR 72 | Temp 98.1°F | Ht 61.0 in | Wt 129.4 lb

## 2018-11-04 DIAGNOSIS — Z0000 Encounter for general adult medical examination without abnormal findings: Secondary | ICD-10-CM

## 2018-11-04 LAB — CBC WITH DIFFERENTIAL/PLATELET
BASOS ABS: 0 10*3/uL (ref 0.0–0.1)
BASOS PCT: 0.4 % (ref 0.0–3.0)
Eosinophils Absolute: 0.1 10*3/uL (ref 0.0–0.7)
Eosinophils Relative: 1.5 % (ref 0.0–5.0)
HEMATOCRIT: 40.3 % (ref 36.0–46.0)
HEMOGLOBIN: 13.5 g/dL (ref 12.0–15.0)
LYMPHS PCT: 41 % (ref 12.0–46.0)
Lymphs Abs: 1.5 10*3/uL (ref 0.7–4.0)
MCHC: 33.6 g/dL (ref 30.0–36.0)
MCV: 93.4 fl (ref 78.0–100.0)
MONOS PCT: 9.5 % (ref 3.0–12.0)
Monocytes Absolute: 0.4 10*3/uL (ref 0.1–1.0)
Neutro Abs: 1.8 10*3/uL (ref 1.4–7.7)
Neutrophils Relative %: 47.6 % (ref 43.0–77.0)
Platelets: 217 10*3/uL (ref 150.0–400.0)
RBC: 4.31 Mil/uL (ref 3.87–5.11)
RDW: 13.7 % (ref 11.5–15.5)
WBC: 3.7 10*3/uL — AB (ref 4.0–10.5)

## 2018-11-04 LAB — LIPID PANEL
Cholesterol: 214 mg/dL — ABNORMAL HIGH (ref 0–200)
HDL: 53.1 mg/dL (ref >39.00)
LDL Cholesterol: 147 mg/dL — ABNORMAL HIGH (ref 0–99)
NonHDL: 160.82
TRIGLYCERIDES: 69 mg/dL (ref 0.0–149.0)
Total CHOL/HDL Ratio: 4
VLDL: 13.8 mg/dL (ref 0.0–40.0)

## 2018-11-04 LAB — POC URINALSYSI DIPSTICK (AUTOMATED)
Bilirubin, UA: NEGATIVE
Glucose, UA: NEGATIVE
Ketones, UA: NEGATIVE
Leukocytes, UA: NEGATIVE
NITRITE UA: NEGATIVE
Protein, UA: NEGATIVE
Spec Grav, UA: >=1.03 — AB (ref 1.010–1.025)
UROBILINOGEN UA: 0.2 U/dL
pH, UA: 6 (ref 5.0–8.0)

## 2018-11-04 LAB — HEPATIC FUNCTION PANEL
ALT: 15 U/L (ref 0–35)
AST: 12 U/L (ref 0–37)
Albumin: 4.6 g/dL (ref 3.5–5.2)
Alkaline Phosphatase: 44 U/L (ref 39–117)
BILIRUBIN DIRECT: 0.2 mg/dL (ref 0.0–0.3)
Total Bilirubin: 0.7 mg/dL (ref 0.2–1.2)
Total Protein: 7.2 g/dL (ref 6.0–8.3)

## 2018-11-04 LAB — BASIC METABOLIC PANEL
BUN: 11 mg/dL (ref 6–23)
CALCIUM: 9.7 mg/dL (ref 8.4–10.5)
CO2: 27 mEq/L (ref 19–32)
CREATININE: 0.73 mg/dL (ref 0.40–1.20)
Chloride: 103 mEq/L (ref 96–112)
GFR: 109.71 mL/min (ref >60.00)
Glucose, Bld: 78 mg/dL (ref 70–99)
Potassium: 4 mEq/L (ref 3.5–5.1)
Sodium: 137 mEq/L (ref 135–145)

## 2018-11-04 LAB — TSH: TSH: 1.05 u[IU]/mL (ref 0.35–4.50)

## 2018-11-04 NOTE — Progress Notes (Signed)
   Subjective:    Patient ID: Kayla Shaw, female    DOB: Jul 13, 1971, 48 y.o.   MRN: [id number]  HPI Here for a well exam. She feels great.    Review of Systems  Constitutional: Negative.   HENT: Negative.   Eyes: Negative.   Respiratory: Negative.   Cardiovascular: Negative.   Gastrointestinal: Negative.   Genitourinary: Negative for decreased urine volume, difficulty urinating, dyspareunia, dysuria, enuresis, flank pain, frequency, hematuria, pelvic pain and urgency.  Musculoskeletal: Negative.   Skin: Negative.   Neurological: Negative.   Psychiatric/Behavioral: Negative.        Objective:   Physical Exam Constitutional:      General: She is not in acute distress.    Appearance: She is well-developed.  HENT:     Head: Normocephalic and atraumatic.     Right Ear: External ear normal.     Left Ear: External ear normal.     Nose: Nose normal.     Mouth/Throat:     Pharynx: No oropharyngeal exudate.  Eyes:     General: No scleral icterus.    Conjunctiva/sclera: Conjunctivae normal.     Pupils: Pupils are equal, round, and reactive to light.  Neck:     Musculoskeletal: Normal range of motion and neck supple.     Thyroid: No thyromegaly.     Vascular: No JVD.  Cardiovascular:     Rate and Rhythm: Normal rate and regular rhythm.     Heart sounds: Normal heart sounds. No murmur. No friction rub. No gallop.   Pulmonary:     Effort: Pulmonary effort is normal. No respiratory distress.     Breath sounds: Normal breath sounds. No wheezing or rales.  Chest:     Chest wall: No tenderness.  Abdominal:     General: Bowel sounds are normal. There is no distension.     Palpations: Abdomen is soft. There is no mass.     Tenderness: There is no abdominal tenderness. There is no guarding or rebound.  Musculoskeletal: Normal range of motion.        General: No tenderness.  Lymphadenopathy:     Cervical: No cervical adenopathy.  Skin:    General: Skin is warm and dry.   Findings: No erythema or rash.  Neurological:     Mental Status: She is alert and oriented to person, place, and time.     Cranial Nerves: No cranial nerve deficit.     Motor: No abnormal muscle tone.     Coordination: Coordination normal.     Deep Tendon Reflexes: Reflexes are normal and symmetric. Reflexes normal.  Psychiatric:        Behavior: Behavior normal.        Thought Content: Thought content normal.        Judgment: Judgment normal.           Assessment & Plan:  Well exam. We discussed diet and exercise. Get fasting labs.  Alysia Penna, MD

## 2018-11-17 ENCOUNTER — Other Ambulatory Visit: Payer: Self-pay | Admitting: Family Medicine

## 2018-11-17 MED ORDER — ATORVASTATIN CALCIUM 10 MG PO TABS: 10.0000 mg | ORAL_TABLET | Freq: Every day | ORAL | 3 refills | Status: DC

## 2019-07-06 ENCOUNTER — Other Ambulatory Visit: Payer: Self-pay | Admitting: Obstetrics and Gynecology

## 2019-07-06 DIAGNOSIS — Z1231 Encounter for screening mammogram for malignant neoplasm of breast: Secondary | ICD-10-CM

## 2019-08-12 ENCOUNTER — Encounter: Payer: Self-pay | Admitting: Family Medicine

## 2019-08-12 ENCOUNTER — Other Ambulatory Visit: Payer: Self-pay

## 2019-08-12 ENCOUNTER — Telehealth (INDEPENDENT_AMBULATORY_CARE_PROVIDER_SITE_OTHER): Payer: BC Managed Care – PPO | Admitting: Family Medicine

## 2019-08-12 DIAGNOSIS — R0981 Nasal congestion: Secondary | ICD-10-CM

## 2019-08-12 DIAGNOSIS — J069 Acute upper respiratory infection, unspecified: Secondary | ICD-10-CM | POA: Diagnosis not present

## 2019-08-12 DIAGNOSIS — R05 Cough: Secondary | ICD-10-CM

## 2019-08-12 DIAGNOSIS — R059 Cough, unspecified: Secondary | ICD-10-CM

## 2019-08-12 MED ORDER — BENZONATATE 100 MG PO CAPS: 200.0000 mg | ORAL_CAPSULE | Freq: Two times a day (BID) | ORAL | 0 refills | Status: AC | PRN

## 2019-08-12 MED ORDER — FLUTICASONE PROPIONATE 50 MCG/ACT NA SUSP
1.0000 | Freq: Two times a day (BID) | NASAL | 3 refills | Status: DC
Start: 2019-08-12 — End: 2019-12-12

## 2019-08-12 MED ORDER — DOXYCYCLINE HYCLATE 100 MG PO TABS: 100.0000 mg | ORAL_TABLET | Freq: Two times a day (BID) | ORAL | 0 refills | Status: AC

## 2019-08-12 MED ORDER — PREDNISONE 20 MG PO TABS: 40.0000 mg | ORAL_TABLET | Freq: Every day | ORAL | 0 refills | Status: AC

## 2019-08-12 NOTE — Progress Notes (Signed)
Virtual Visit via Video Note   I connected with Kayla Shaw on 08/12/19 by a video enabled telemedicine application and verified that I am speaking with the correct person using two identifiers.  Location patient: home Location provider:work office Persons participating in the virtual visit: patient, provider  I discussed the limitations of evaluation and management by telemedicine and the availability of in person appointments. The patient expressed understanding and agreed to proceed.   HPI: Kayla Shaw is a 48 yo female with hx of IBS and GERD c/o respiratory symptoms,she thinks she has a "sinus infection." Started with "sinus pressure" a week ago,she took Benadryl and felt better. Next day she noted nasal congestion,rhinorreha,sore throat,productive cough with yellowish sputum. Rhinorrhea with bloody tinge. Frontal pressure headache with no associated visual changes or neurologic deficit.  States that she gets "sinus infections" 1-2 per year. Denies hx of allergic rhinitis.  She denies fever,chills,dysphagia,earache,wheezing,stridor,CP,dyspnea, abdominal pain,N/V,diarreha,or skin rash.  No sick contact. Denies changes in smell or taste.  She has taken Tylenol.  Symptoms are stable.   ROS: See pertinent positives and negatives per HPI.  Past Medical History:  Diagnosis Date  . 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 GERD (gastroesophageal reflux disease)   . Irritable bowel syndrome (IBS)     Past Surgical History:  Procedure Laterality Date  . ANAL FISSURE REPAIR  2009    Family History  Problem Relation Age of Onset  . Diabetes Mother   . Hypertension Mother   . Diabetes Father   . Hypertension Father   . Stroke Father   . Anesthesia problems Neg Hx     Social History   Socioeconomic History  . Marital status: Married    Spouse name: Not on file  . Number of children: Not on file  . Years of education: Not on file  . Highest education level: Not on file   Occupational History  . Not on file  Social Needs  . Financial resource strain: Not on file  . Food insecurity    Worry: Not on file    Inability: Not on file  . Transportation needs    Medical: Not on file    Non-medical: Not on file  Tobacco Use  . Smoking status: Never Smoker  . Smokeless tobacco: Never Used  Substance and Sexual Activity  . Alcohol use: Yes    Alcohol/week: 0.0 standard drinks    Comment: occ  . Drug use: No  . Sexual activity: Yes  Lifestyle  . Physical activity    Days per week: Not on file    Minutes per session: Not on file  . Stress: Not on file  Relationships  . Social Herbalist on phone: Not on file    Gets together: Not on file    Attends religious service: Not on file    Active member of club or organization: Not on file    Attends meetings of clubs or organizations: Not on file    Relationship status: Not on file  . Intimate partner violence    Fear of current or ex partner: Not on file    Emotionally abused: Not on file    Physically abused: Not on file    Forced sexual activity: Not on file  Other Topics Concern  . Not on file  Social History Narrative  . Not on file    Current Outpatient Medications:  .  atorvastatin (LIPITOR) 10 MG tablet, Take 1 tablet (  10 mg total) by mouth daily., Disp: 90 tablet, Rfl: 3 .  benzonatate (TESSALON) 100 MG capsule, Take 2 capsules (200 mg total) by mouth 2 (two) times daily as needed for up to 10 days., Disp: 40 capsule, Rfl: 0 .  doxycycline (VIBRA-TABS) 100 MG tablet, Take 1 tablet (100 mg total) by mouth 2 (two) times daily for 7 days., Disp: 14 tablet, Rfl: 0 .  fluticasone (FLONASE) 50 MCG/ACT nasal spray, Place 1 spray into both nostrils 2 (two) times daily., Disp: 16 g, Rfl: 3 .  levonorgestrel (MIRENA) 20 MCG/24HR IUD, 1 each by Intrauterine route once., Disp: , Rfl:  .  predniSONE (DELTASONE) 20 MG tablet, Take 2 tablets (40 mg total) by mouth daily with breakfast for 3 days.,  Disp: 6 tablet, Rfl: 0 .  valACYclovir (VALTREX) 1000 MG tablet, Take 1,000 mg by mouth as needed. , Disp: , Rfl:   EXAM:  VITALS per patient if applicable:N/A  GENERAL: alert, oriented, appears well and in no acute distress  HEENT: atraumatic, conjunctiva clear, no obvious abnormalities on inspection of external nose and ears Nasal voice. NECK: normal movements of the head and neck  LUNGS: on inspection no signs of respiratory distress, breathing rate appears normal, no obvious gross SOB, gasping or wheezing  CV: no obvious cyanosis  Kayla: moves all visible extremities without noticeable abnormality  PSYCH/NEURO: pleasant and cooperative, no obvious depression or anxiety, speech and thought processing grossly intact  ASSESSMENT AND PLAN:  Discussed the following assessment and plan:  URI, acute Most likely viral,I do not think abx is necessary. Monitor for new symptoms. Symptomatic treatment recommended.  Nasal sinus congestion - Plan: predniSONE (DELTASONE) 20 MG tablet, fluticasone (FLONASE) 50 MCG/ACT nasal spray I do not think symptoms are caused by bacterial sinusitis. Prednisone may help with sinus pressure. After completing Prednisone she can continue Flonase nasal spray daily as needed. Nasal irrigations with saline as needed.  Explained that abx is not needed at this time. If she is not any better in 3-4 days she can start abx, side effects discussed.  Cough - Plan: benzonatate (TESSALON) 100 MG capsule Adequate hydration. I do nit think imaging is needed at this time. Instructed about warning signs.   I discussed the assessment and treatment plan with the patient. She was provided an opportunity to ask questions and all were answered. She agreed with the plan and demonstrated an understanding of the instructions.   .  Return if symptoms worsen or fail to improve.     [place], MD

## 2019-08-19 ENCOUNTER — Ambulatory Visit
Admission: RE | Admit: 2019-08-19 | Discharge: 2019-08-19 | Disposition: A | Discharge status: Home / Self Care | Payer: BC Managed Care – PPO | Source: Ambulatory Visit | Attending: Obstetrics and Gynecology | Admitting: Obstetrics and Gynecology

## 2019-08-19 ENCOUNTER — Other Ambulatory Visit: Payer: Self-pay

## 2019-08-19 DIAGNOSIS — Z1231 Encounter for screening mammogram for malignant neoplasm of breast: Secondary | ICD-10-CM

## 2019-12-12 ENCOUNTER — Other Ambulatory Visit: Payer: Self-pay | Admitting: Family Medicine

## 2019-12-12 DIAGNOSIS — R0981 Nasal congestion: Secondary | ICD-10-CM

## 2019-12-20 ENCOUNTER — Other Ambulatory Visit: Payer: Self-pay | Admitting: Podiatry

## 2019-12-20 ENCOUNTER — Encounter: Payer: Self-pay | Admitting: Podiatry

## 2019-12-20 ENCOUNTER — Ambulatory Visit: Payer: BC Managed Care – PPO | Admitting: Podiatry

## 2019-12-20 ENCOUNTER — Ambulatory Visit (INDEPENDENT_AMBULATORY_CARE_PROVIDER_SITE_OTHER): Payer: BC Managed Care – PPO

## 2019-12-20 ENCOUNTER — Other Ambulatory Visit: Payer: Self-pay

## 2019-12-20 VITALS — BP 110/66 | HR 80 | Resp 16

## 2019-12-20 DIAGNOSIS — M76821 Posterior tibial tendinitis, right leg: Secondary | ICD-10-CM | POA: Diagnosis not present

## 2019-12-20 DIAGNOSIS — M2141 Flat foot [pes planus] (acquired), right foot: Secondary | ICD-10-CM

## 2019-12-20 DIAGNOSIS — M7750 Other enthesopathy of unspecified foot: Secondary | ICD-10-CM

## 2019-12-20 DIAGNOSIS — M722 Plantar fascial fibromatosis: Secondary | ICD-10-CM

## 2019-12-20 DIAGNOSIS — M2142 Flat foot [pes planus] (acquired), left foot: Secondary | ICD-10-CM | POA: Diagnosis not present

## 2019-12-20 MED ORDER — METHYLPREDNISOLONE 4 MG PO TBPK: ORAL_TABLET | ORAL | 0 refills | Status: DC

## 2019-12-20 MED ORDER — MELOXICAM 15 MG PO TABS: 15.0000 mg | ORAL_TABLET | Freq: Every day | ORAL | 3 refills | Status: DC

## 2019-12-20 NOTE — Progress Notes (Signed)
Subjective:  Patient ID: Kayla Shaw, female    DOB: 10/28/1970,  MRN: [id number] HPI Chief Complaint  Patient presents with  . Ankle Pain    Medial ankle right - aching x 3 months, no injury, no swelling  . Foot Pain    Plantar heel left - aching x few weeks, AM pain  . New Patient (Initial Visit)    49 y.o. female presents with the above complaint.   ROS: Denies fever chills nausea vomiting muscle aches pains calf pain back pain chest pain shortness of breath.  Past Medical History:  Diagnosis Date  . 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 GERD (gastroesophageal reflux disease)   . Irritable bowel syndrome (IBS)    Past Surgical History:  Procedure Laterality Date  . ANAL FISSURE REPAIR  2009    Current Outpatient Medications:  .  atorvastatin (LIPITOR) 10 MG tablet, Take 1 tablet (10 mg total) by mouth daily., Disp: 90 tablet, Rfl: 3 .  levonorgestrel (MIRENA) 20 MCG/24HR IUD, 1 each by Intrauterine route once., Disp: , Rfl:  .  meloxicam (MOBIC) 15 MG tablet, Take 1 tablet (15 mg total) by mouth daily., Disp: 30 tablet, Rfl: 3 .  methylPREDNISolone (MEDROL DOSEPAK) 4 MG TBPK tablet, 6 day dose pack - take as directed, Disp: 21 tablet, Rfl: 0 .  valACYclovir (VALTREX) 1000 MG tablet, Take 1,000 mg by mouth as needed. , Disp: , Rfl:   Allergies  Allergen Reactions  . Sulfamethoxazole-Trimethoprim   . Septra [Sulfamethoxazole-Trimethoprim] Rash and Other (See Comments)    Muscle pain & hot flashes   Review of Systems Objective:   Vitals:   12/20/19 0841  BP: 110/66  Pulse: 80  Resp: 16    General: Well developed, nourished, in no acute distress, alert and oriented x3   Dermatological: Skin is warm, dry and supple bilateral. Nails x 10 are well maintained; remaining integument appears unremarkable at this time. There are no open sores, no preulcerative lesions, no rash or signs of infection present.  Vascular: Dorsalis Pedis artery and Posterior Tibial  artery pedal pulses are 2/4 bilateral with immedate capillary fill time. Pedal hair growth present. No varicosities and no lower extremity edema present bilateral.   Neruologic: Grossly intact via light touch bilateral. Vibratory intact via tuning fork bilateral. Protective threshold with Semmes Wienstein monofilament intact to all pedal sites bilateral. Patellar and Achilles deep tendon reflexes 2+ bilateral. No Babinski or clonus noted bilateral.   Musculoskeletal: No gross boney pedal deformities bilateral. No pain, crepitus, or limitation noted with foot and ankle range of motion bilateral. Muscular strength 5/5 in all groups tested bilateral.  She has pain on palpation posterior tibial tendon right.  Just beneath the medial malleolus.  Also she has pain on palpation medial calcaneal tubercle left heel.  Flexible pes planus is noted bilateral  Gait: Unassisted, Nonantalgic.    Radiographs:  Radiographs demonstrate pes planus bilaterally no significant ankle abnormality right foot.  She does have soft tissue increase in density at the plantar fat-containing insertion site of the left foot left heel primarily.  No acute findings.  No coalitions identified.  Assessment & Plan:   Assessment: Plantar fasciitis posterior tibial tendinitis infra malleoli are right.  Plantar fasciitis left foot.  Pes planus bilateral flexible.  Plan: Discussed etiology pathology conservative or surgical therapies at this point I injected 2 mg of dexamethasone and local anesthetic point of maximal tenderness infra malleoli right side just into the tendon  sheath itself of the posterior tibial tendon.  Left foot I injected 20 mg Kenalog 5 mg Marcaine for maximal tenderness of the left heel.  This was performed after sterile Betadine skin prep.  Tolerated procedure well without complications.  Started her on a Medrol Dosepak to be followed by meloxicam.  Also placed her in a plantar fascial brace to be followed by a  night splint.  We discussed appropriate shoe gear stretching exercises shoe gear modifications.  I will follow-up with her in 1 month.     Tanish Sinkler T. [place], [place]

## 2019-12-20 NOTE — Patient Instructions (Signed)

## 2020-01-10 ENCOUNTER — Telehealth: Payer: Self-pay | Admitting: *Deleted

## 2020-01-10 DIAGNOSIS — M722 Plantar fascial fibromatosis: Secondary | ICD-10-CM

## 2020-01-10 DIAGNOSIS — M76821 Posterior tibial tendinitis, right leg: Secondary | ICD-10-CM

## 2020-01-10 DIAGNOSIS — M2141 Flat foot [pes planus] (acquired), right foot: Secondary | ICD-10-CM

## 2020-01-10 NOTE — Telephone Encounter (Signed)
I spoke with pt and instructed pt she could come in tomorrow 8:00am to 5:00pm to be fitted for a stabilizing cam boot.

## 2020-01-10 NOTE — Telephone Encounter (Signed)
Pt states she was seen one month for right ankle tendonitis and the meloxicam and she is hurting.

## 2020-01-10 NOTE — Telephone Encounter (Signed)
I spoke with pt and asked if she was wearing a brace and she states she is wearing one on the left foot and is doing better but the right ankle is not better. I asked pt if she was icing and she stated not consistently, because it wore off. I instructed pt, ice 3-4 times a day 15 - 20 minutes/session it would decrease the inflammation, which would decrease swelling and pain over time. Pt stated understanding. I told pt I would see if Dr. Milinda Pointer wanted her to have other treatment until seen in office.

## 2020-01-10 NOTE — Telephone Encounter (Signed)
Yes put her in a boot.

## 2020-01-17 ENCOUNTER — Encounter: Payer: Self-pay | Admitting: Podiatry

## 2020-01-17 ENCOUNTER — Ambulatory Visit: Payer: BC Managed Care – PPO | Admitting: Podiatry

## 2020-01-17 ENCOUNTER — Other Ambulatory Visit: Payer: Self-pay

## 2020-01-17 DIAGNOSIS — M76821 Posterior tibial tendinitis, right leg: Secondary | ICD-10-CM

## 2020-01-17 DIAGNOSIS — M66871 Spontaneous rupture of other tendons, right ankle and foot: Secondary | ICD-10-CM | POA: Diagnosis not present

## 2020-01-17 DIAGNOSIS — M722 Plantar fascial fibromatosis: Secondary | ICD-10-CM | POA: Diagnosis not present

## 2020-01-17 NOTE — Telephone Encounter (Signed)
-----   Message from Rip Harbour, [facility] sent at 01/17/2020 11:46 AM EDT ----- Regarding: MRI MRI right ankle - evaluate posterior tibial tendon tear right - surgical consideration

## 2020-01-17 NOTE — Progress Notes (Signed)
She presents today for follow-up of tendinitis posterior tibial tendinitis of the right foot.  She states that she stopped by to pick up a boot that she and I had talked about and she had initially declined.  She states that it feels better but if she walks without it it hurts just as bad as it did initially.  She states that even with the steroids and the injection and never got a lot better after the medicine was taken.  She states that the left foot with the plantar fasciitis is completely resolved at this point.  Objective: Vital signs are stable alert and oriented x3.  Pulses are palpable.  She presents today ambulating tennis shoe left foot Cam walker right foot.  Still has pain on palpation posterior tibial tendon of the right foot.  Pulses are palpable.  Mild edema along the inferior medial malleolus area.  Assessment: Resolving plantar fasciitis left.  Cannot rule out a tear to the PT tendon right.  Plan: At this point we will request an MRI of the posterior tibial tendon left to make sure that there is no tear just in the inframalleolar area where is most common to tear.  At this point I feel that this would be prudent before sending her to physical therapy or any type of surgical procedure be performed.  As long as there is no tear may need to consider orthotics and physical therapy.  Continue conservative therapies for plantar fasciitis left.

## 2020-01-20 NOTE — Telephone Encounter (Signed)
Faxed orders with authorization to [facility]. 

## 2020-01-20 NOTE — Telephone Encounter (Signed)
[place]- Prior Authorization  Approved -   Approval # [id number]  Valid through: 01/20/20-07/22/20

## 2020-02-06 ENCOUNTER — Other Ambulatory Visit: Payer: Self-pay

## 2020-02-06 ENCOUNTER — Ambulatory Visit
Admission: RE | Admit: 2020-02-06 | Discharge: 2020-02-06 | Disposition: A | Discharge status: Home / Self Care | Payer: BC Managed Care – PPO | Source: Ambulatory Visit | Attending: Podiatry | Admitting: Podiatry

## 2020-02-06 DIAGNOSIS — M2141 Flat foot [pes planus] (acquired), right foot: Secondary | ICD-10-CM

## 2020-02-06 DIAGNOSIS — M2142 Flat foot [pes planus] (acquired), left foot: Secondary | ICD-10-CM

## 2020-02-06 DIAGNOSIS — M76821 Posterior tibial tendinitis, right leg: Secondary | ICD-10-CM

## 2020-02-06 DIAGNOSIS — M722 Plantar fascial fibromatosis: Secondary | ICD-10-CM

## 2020-02-10 ENCOUNTER — Telehealth: Payer: Self-pay | Admitting: *Deleted

## 2020-02-10 NOTE — Telephone Encounter (Signed)
I informed pt of Dr. Stephenie Acres request to send a copy of the MRI disc to a radiology specialist for more details for treatment planning, there would 10-14 day delay in the final results, once received she would be contacted with treatment information, and to continue Dr. Stephenie Acres current therapy. Faxed request to [place] for MRI disc copy.

## 2020-02-10 NOTE — Telephone Encounter (Signed)
-----   Message from Garrel Ridgel, [place] sent at 02/07/2020  6:59 AM EDT ----- Please send for an over read and inform patient of the delay.  Thank you.

## 2020-02-14 NOTE — Telephone Encounter (Signed)
Received copy of MRI disc 02/13/2020 afternoon and mailed to [place] today.

## 2020-03-14 ENCOUNTER — Telehealth (INDEPENDENT_AMBULATORY_CARE_PROVIDER_SITE_OTHER): Payer: BC Managed Care – PPO | Admitting: Family Medicine

## 2020-03-14 ENCOUNTER — Encounter: Payer: Self-pay | Admitting: Family Medicine

## 2020-03-14 DIAGNOSIS — U071 COVID-19: Secondary | ICD-10-CM | POA: Diagnosis not present

## 2020-03-14 MED ORDER — HYDROCODONE-HOMATROPINE 5-1.5 MG/5ML PO SYRP: 5.0000 mL | ORAL_SOLUTION | ORAL | 0 refills | Status: DC | PRN

## 2020-03-14 NOTE — Progress Notes (Signed)
Subjective:    Patient ID: Kayla Shaw, female    DOB: 03-17-1971, 49 y.o.   MRN: [id number]  HPI Here for a Covid-related cough. 3 days ago she developed a dry cough, mild diarrhea, sinus congestion, and an altered sense of taste. No fever or body aches or nausea. No chest pain or SOB. She then tested positive for the Covid-19 virus yesterday. She is quarantining at home for 10 days and she is drinking fluids. She asks for some cough syrup to use.  Virtual Visit via Video Note  I connected with the patient on 03/14/20 at  9:45 AM EDT by a video enabled telemedicine application and verified that I am speaking with the correct person using two identifiers.  Location patient: home Location provider:work or home office Persons participating in the virtual visit: patient, provider  I discussed the limitations of evaluation and management by telemedicine and the availability of in person appointments. The patient expressed understanding and agreed to proceed.   HPI:    ROS: See pertinent positives and negatives per HPI.  Past Medical History:  Diagnosis Date  . 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 GERD (gastroesophageal reflux disease)   . Irritable bowel syndrome (IBS)     Past Surgical History:  Procedure Laterality Date  . ANAL FISSURE REPAIR  2009    Family History  Problem Relation Age of Onset  . Diabetes Mother   . Hypertension Mother   . Diabetes Father   . Hypertension Father   . Stroke Father   . Anesthesia problems Neg Hx      Current Outpatient Medications:  .  atorvastatin (LIPITOR) 10 MG tablet, Take 1 tablet (10 mg total) by mouth daily., Disp: 90 tablet, Rfl: 3 .  HYDROcodone-homatropine (HYDROMET) 5-1.5 MG/5ML syrup, Take 5 mLs by mouth every 4 (four) hours as needed., Disp: 240 mL, Rfl: 0 .  levonorgestrel (MIRENA) 20 MCG/24HR IUD, 1 each by Intrauterine route once., Disp: , Rfl:  .  meloxicam (MOBIC) 15 MG tablet, Take 1 tablet (15 mg total) by  mouth daily., Disp: 30 tablet, Rfl: 3 .  valACYclovir (VALTREX) 1000 MG tablet, Take 1,000 mg by mouth as needed. , Disp: , Rfl:   EXAM:  VITALS per patient if applicable:  GENERAL: alert, oriented, appears well and in no acute distress  HEENT: atraumatic, conjunttiva clear, no obvious abnormalities on inspection of external nose and ears  NECK: normal movements of the head and neck  LUNGS: on inspection no signs of respiratory distress, breathing rate appears normal, no obvious gross SOB, gasping or wheezing  CV: no obvious cyanosis  MS: moves all visible extremities without noticeable abnormality  PSYCH/NEURO: pleasant and cooperative, no obvious depression or anxiety, speech and thought processing grossly intact  ASSESSMENT AND PLAN: Covid infection with cough. She can use Hydromet as needed. Follow up prn.  Alysia Penna, MD  Discussed the following assessment and plan:  No diagnosis found.     I discussed the assessment and treatment plan with the patient. The patient was provided an opportunity to ask questions and all were answered. The patient agreed with the plan and demonstrated an understanding of the instructions.   The patient was advised to call back or seek an in-person evaluation if the symptoms worsen or if the condition fails to improve as anticipated.    Review of Systems  Constitutional: Negative.   HENT: Positive for congestion. Negative for postnasal drip, sinus pain and sore  throat.   Eyes: Negative.   Respiratory: Positive for cough. Negative for shortness of breath and wheezing.   Cardiovascular: Negative.   Gastrointestinal: Positive for diarrhea. Negative for abdominal pain, nausea and vomiting.       Objective:   Physical Exam        Assessment & Plan:

## 2020-04-17 ENCOUNTER — Telehealth (INDEPENDENT_AMBULATORY_CARE_PROVIDER_SITE_OTHER): Payer: BC Managed Care – PPO | Admitting: Family Medicine

## 2020-04-17 ENCOUNTER — Encounter: Payer: Self-pay | Admitting: Family Medicine

## 2020-04-17 DIAGNOSIS — R05 Cough: Secondary | ICD-10-CM | POA: Diagnosis not present

## 2020-04-17 DIAGNOSIS — R0982 Postnasal drip: Secondary | ICD-10-CM | POA: Diagnosis not present

## 2020-04-17 DIAGNOSIS — R0981 Nasal congestion: Secondary | ICD-10-CM

## 2020-04-17 DIAGNOSIS — R059 Cough, unspecified: Secondary | ICD-10-CM

## 2020-04-17 MED ORDER — BENZONATATE 100 MG PO CAPS
100.0000 mg | ORAL_CAPSULE | Freq: Three times a day (TID) | ORAL | 0 refills | Status: DC | PRN
Start: 2020-04-17 — End: 2020-06-26

## 2020-04-17 NOTE — Patient Instructions (Signed)
-  nasal saline twice dialy  -over the counter analgesic if needed per instructions  -sent a prescription for tessalon for cough to your pharmacy if needed  I hope you are feeling better soon! Seek care promptly if your symptoms worsen, new concerns arise or you are not improving with treatment.     WORK SLIP:  Patient Kayla Shaw,  09/15/1971, was seen for a medical visit today. Please excuse from work until symptoms have resolved for 48 hours.  Sincerely: E-signature: Dr. Colin Benton, DO [place] Ph: [phone]

## 2020-04-17 NOTE — Progress Notes (Signed)
Virtual Visit via Telephone Note  I connected with Kayla Shaw on 04/17/20 at 10:40 AM EDT by telephone as the video platform failed and verified that I am speaking with the correct person using two identifiers.   I discussed the limitations, risks, security and privacy concerns of performing an evaluation and management service by telephone and the availability of in person appointments. I also discussed with the patient that there may be a patient responsible charge related to this service. The patient expressed understanding and agreed to proceed.  Location patient: home Location provider: work or home office, [place] Participants present for the call: patient, provider Patient did not have a visit in the prior 7 days to address this/these issue(s).   History of Present Illness:  Acute visit for sinus issues: -started about 5 days ago -symptoms include sore throat initially, sinus congestion, cough, PND, hoarseness -had COVID19 last month - but symptoms had resolved -denies SOB, fever, body aches, NVD, rash -has an 49 yo in school - he has had some sinus issues too -she is around others outside the household -denies asthma or immunocompromise    Observations/Objective: Patient sounds cheerful and well on the phone. I do not appreciate any SOB. Speech and thought processing are grossly intact. Patient reported vitals:  Assessment and Plan:  Nasal congestion  Cough  PND (post-nasal drip)  -we discussed possible serious and likely etiologies, options for evaluation and workup, limitations of telemedicine visit vs in person visit, treatment, treatment risks and precautions. Pt prefers to treat via telemedicine empirically rather then risking or undertaking an in person visit at this moment. Suspect VURI. COVID19 less likely given recent covid infection. Discussed symptomatic care, sent rx for Tessalon for cough and discussed follow up and emergency precautions. Advised staying home  from work until feeling bettter and using covid precautions when returns to work.  Also advised still should get the COVID19 vaccine. Patient agrees to seek prompt in person care if worsening, new symptoms arise, or if is not improving with treatment.  Follow Up Instructions:  I did not refer this patient for an OV in the next 24 hours for this/these issue(s).  I discussed the assessment and treatment plan with the patient. The patient was provided an opportunity to ask questions and all were answered. The patient agreed with the plan and demonstrated an understanding of the instructions.   The patient was advised to call back or seek an in-person evaluation if the symptoms worsen or if the condition fails to improve as anticipated.  I provided 18 minutes of non-face-to-face time during this encounter.   Lucretia Kern, DO

## 2020-06-18 ENCOUNTER — Telehealth: Payer: Self-pay

## 2020-06-18 DIAGNOSIS — M76821 Posterior tibial tendinitis, right leg: Secondary | ICD-10-CM

## 2020-06-18 DIAGNOSIS — S93421D Sprain of deltoid ligament of right ankle, subsequent encounter: Secondary | ICD-10-CM

## 2020-06-18 NOTE — Telephone Encounter (Signed)
Pt called and wanted to follow up on MRI results that were send to radiology specialist a few months ago. Pt is still in pain and wanted to follow up on what to do next.

## 2020-06-20 NOTE — Telephone Encounter (Signed)
Called for overread report

## 2020-06-20 NOTE — Telephone Encounter (Signed)
Thank you :)

## 2020-06-21 MED ORDER — MELOXICAM 15 MG PO TABS
15.0000 mg | ORAL_TABLET | Freq: Every day | ORAL | 3 refills | Status: DC
Start: 2020-06-21 — End: 2020-10-22

## 2020-06-21 NOTE — Telephone Encounter (Signed)
Received over read report - Dr. Milinda Pointer reviewed - informed patient of results - recommended PT and NSAIDS for sprain tendon   I will send in a referral for PT (near [employer]) and Mobic to the pharmacy

## 2020-06-21 NOTE — Addendum Note (Signed)
Addended by: Clovis Riley E on: 06/21/2020 10:01 AM   Modules accepted: Orders

## 2020-06-25 ENCOUNTER — Other Ambulatory Visit: Payer: Self-pay

## 2020-06-26 ENCOUNTER — Encounter: Payer: Self-pay | Admitting: Family Medicine

## 2020-06-26 ENCOUNTER — Ambulatory Visit: Payer: BC Managed Care – PPO | Admitting: Family Medicine

## 2020-06-26 VITALS — BP 116/60 | HR 97 | Temp 99.3°F | Wt 137.0 lb

## 2020-06-26 DIAGNOSIS — F418 Other specified anxiety disorders: Secondary | ICD-10-CM | POA: Diagnosis not present

## 2020-06-26 DIAGNOSIS — L659 Nonscarring hair loss, unspecified: Secondary | ICD-10-CM

## 2020-06-26 MED ORDER — ESCITALOPRAM OXALATE 10 MG PO TABS
10.0000 mg | ORAL_TABLET | Freq: Every day | ORAL | 2 refills | Status: DC
Start: 2020-06-26 — End: 2020-09-17

## 2020-06-26 NOTE — Addendum Note (Signed)
Addended by: Marrion Coy on: 06/26/2020 02:03 PM   Modules accepted: Orders

## 2020-06-26 NOTE — Progress Notes (Signed)
° °  Subjective:    Patient ID: Kayla Shaw, female    DOB: 04/06/1971, 49 y.o.   MRN: [id number]  HPI Here for several issues. First over the past 2 months she has noticed more hair loss than usual. This is diffuse and not patchy. She knows this has sometimes been seen in patients who have had infections with the Covid virus, and she did have this infection in May of this year. Also she had been using gummies with Ashwaganda in them, and she read that this can interfere with the thyroid gland's function, so she stopped taking this. She has tid certain vitamins and biotin, with no results. She also admits to dealing with a lot of stress. She is divorced and she is the single mother of an autistic child, so she does have a lot of stress in her life. She sleeps well and her appetite is good. She has been seeing a therapist for a few months.    Review of Systems  Constitutional: Negative.   Respiratory: Negative.   Cardiovascular: Negative.   Gastrointestinal: Negative.   Endocrine: Negative.   Genitourinary: Negative.   Neurological: Negative.   Psychiatric/Behavioral: Positive for dysphoric mood. Negative for agitation, behavioral problems, confusion, decreased concentration, hallucinations, self-injury, sleep disturbance and suicidal ideas. The patient is nervous/anxious.        Objective:   Physical Exam Constitutional:      Appearance: Normal appearance.  Cardiovascular:     Rate and Rhythm: Normal rate and regular rhythm.     Pulses: Normal pulses.     Heart sounds: Normal heart sounds.  Pulmonary:     Effort: Pulmonary effort is normal.     Breath sounds: Normal breath sounds.  Skin:    Comments: She has some thinning of her hair at the temples   Neurological:     Mental Status: She is alert.           Assessment & Plan:  Her hair loss could be the result of several things, including having had a Covid-19 infection and from stress. We will get labs today to check for  thyroid issues, anemia, etc. For the stress, she will try Lexapro 10 mg daily.  Alysia Penna, MD

## 2020-06-27 LAB — HEPATIC FUNCTION PANEL
AG Ratio: 2 (calc) (ref 1.0–2.5)
ALT: 14 U/L (ref 6–29)
AST: 13 U/L (ref 10–35)
Albumin: 4.7 g/dL (ref 3.6–5.1)
Alkaline phosphatase (APISO): 55 U/L (ref 31–125)
Bilirubin, Direct: 0.1 mg/dL (ref 0.0–0.2)
Globulin: 2.4 g/dL (calc) (ref 1.9–3.7)
Indirect Bilirubin: 0.2 mg/dL (calc) (ref 0.2–1.2)
Total Bilirubin: 0.3 mg/dL (ref 0.2–1.2)
Total Protein: 7.1 g/dL (ref 6.1–8.1)

## 2020-06-27 LAB — T4, FREE: Free T4: 1.1 ng/dL (ref 0.8–1.8)

## 2020-06-27 LAB — CBC WITH DIFFERENTIAL/PLATELET
Absolute Monocytes: 343 cells/uL (ref 200–950)
Basophils Absolute: 9 cells/uL (ref 0–200)
Basophils Relative: 0.2 %
Eosinophils Absolute: 80 cells/uL (ref 15–500)
Eosinophils Relative: 1.7 %
HCT: 39.2 % (ref 35.0–45.0)
Hemoglobin: 13.1 g/dL (ref 11.7–15.5)
Lymphs Abs: 1302 cells/uL (ref 850–3900)
MCH: 30.8 pg (ref 27.0–33.0)
MCHC: 33.4 g/dL (ref 32.0–36.0)
MCV: 92.2 fL (ref 80.0–100.0)
MPV: 10.7 fL (ref 7.5–12.5)
Monocytes Relative: 7.3 %
Neutro Abs: 2966 cells/uL (ref 1500–7800)
Neutrophils Relative %: 63.1 %
Platelets: 237 10*3/uL (ref 140–400)
RBC: 4.25 10*6/uL (ref 3.80–5.10)
RDW: 12.7 % (ref 11.0–15.0)
Total Lymphocyte: 27.7 %
WBC: 4.7 10*3/uL (ref 3.8–10.8)

## 2020-06-27 LAB — BASIC METABOLIC PANEL
BUN: 13 mg/dL (ref 7–25)
CO2: 26 mmol/L (ref 20–32)
Calcium: 10.1 mg/dL (ref 8.6–10.2)
Chloride: 105 mmol/L (ref 98–110)
Creat: 0.79 mg/dL (ref 0.50–1.10)
Glucose, Bld: 102 mg/dL — ABNORMAL HIGH (ref 65–99)
Potassium: 4.3 mmol/L (ref 3.5–5.3)
Sodium: 140 mmol/L (ref 135–146)

## 2020-06-27 LAB — T3, FREE: T3, Free: 2.9 pg/mL (ref 2.3–4.2)

## 2020-06-27 LAB — TSH: TSH: 0.76 mIU/L

## 2020-07-10 ENCOUNTER — Ambulatory Visit: Payer: BC Managed Care – PPO | Admitting: Physical Therapy

## 2020-07-16 ENCOUNTER — Ambulatory Visit: Payer: BC Managed Care – PPO

## 2020-07-26 ENCOUNTER — Ambulatory Visit: Payer: BC Managed Care – PPO | Attending: Podiatry

## 2020-07-26 ENCOUNTER — Other Ambulatory Visit: Payer: Self-pay

## 2020-07-26 DIAGNOSIS — M76821 Posterior tibial tendinitis, right leg: Secondary | ICD-10-CM | POA: Diagnosis not present

## 2020-07-26 DIAGNOSIS — M6281 Muscle weakness (generalized): Secondary | ICD-10-CM | POA: Diagnosis present

## 2020-07-26 DIAGNOSIS — S93421A Sprain of deltoid ligament of right ankle, initial encounter: Secondary | ICD-10-CM | POA: Insufficient documentation

## 2020-07-26 DIAGNOSIS — M216X1 Other acquired deformities of right foot: Secondary | ICD-10-CM | POA: Diagnosis present

## 2020-07-26 NOTE — Therapy (Signed)
[place], [place], [address] Phone: [phone]   Fax:  [phone]  Physical Therapy Evaluation  Patient Details  Name: Kayla Shaw MRN: [id number] Date of Birth: Oct 05, 1971 Referring Provider (PT): Garrel Ridgel, [place]   Encounter Date: 07/26/2020   PT End of Session - 07/26/20 1222    Visit Number 1    Number of Visits 8    Date for PT Re-Evaluation 09/20/20    Authorization Type BCBS    PT Start Time 1104    PT Stop Time 1153    PT Time Calculation (min) 49 min    Activity Tolerance Patient tolerated treatment well    Behavior During Therapy [facility] for tasks assessed/performed           Past Medical History:  Diagnosis Date  . 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 GERD (gastroesophageal reflux disease)   . Irritable bowel syndrome (IBS)     Past Surgical History:  Procedure Laterality Date  . ANAL FISSURE REPAIR  2009    There were no vitals filed for this visit.    Subjective Assessment - 07/26/20 1112    Subjective Pain started last October in R foot, but she thought it would go away. When her L foot started hurting, she saw her doctor in March getting the MRI. She called for another referral since her foot hasn't gotten better.    Limitations Standing;Walking;Lifting    How long can you stand comfortably? an hour    How long can you walk comfortably? mostly sore    Diagnostic tests MRI: negative except small joint effusion    Currently in Pain? Yes    Pain Score 4    7-8/10 at worst   Pain Location Ankle    Pain Orientation Right    Pain Descriptors / Indicators Sore   sharp/shooting at times   Pain Type Chronic pain    Pain Onset More than a month ago    Pain Frequency Intermittent    Aggravating Factors  standing, walking, lifting    Pain Relieving Factors rest, meds              [facility] PT Assessment - 07/26/20 0001      Assessment   Medical Diagnosis Right Posterior  Tibialis Tendinitis, Rght Sprain of Deltoid Ligament    Referring Provider (PT) Max Villa Herb, DPM    Onset Date/Surgical Date 08/26/19    Hand Dominance Right    Next MD Visit PRN    Prior Therapy PT      Balance Screen   Has the patient fallen in the past 6 months Yes    How many times? 1x   tripped over a cord and scraped up R knee   Has the patient had a decrease in activity level because of a fear of falling?  Yes    Is the patient reluctant to leave their home because of a fear of falling?  No      Prior Function   Level of Independence Independent    Vocation Full time employment    Vocation Requirements some walking    Leisure Watch TV, lifting weights, walking      Observation/Other Assessments   Focus on Therapeutic Outcomes (FOTO)  37% limitation      ROM / Strength   AROM / PROM / Strength AROM;Strength      AROM   AROM Assessment Site Ankle  Right/Left Ankle Right;Left    Right Ankle Dorsiflexion 10    Right Ankle Plantar Flexion 50    Right Ankle Inversion 45    Right Ankle Eversion 7    Left Ankle Dorsiflexion 10    Left Ankle Plantar Flexion 55    Left Ankle Inversion 40    Left Ankle Eversion 10      Strength   Strength Assessment Site Ankle    Right/Left Ankle Right;Left    Right Ankle Dorsiflexion 4/5    Right Ankle Plantar Flexion 4/5    Right Ankle Inversion 4/5    Right Ankle Eversion 4-/5    Left Ankle Dorsiflexion 4+/5    Left Ankle Plantar Flexion 4+/5    Left Ankle Inversion 4+/5    Left Ankle Eversion 4-/5      Palpation   Palpation comment TTP along entire muscle belly of posterior tibialis      Ambulation/Gait   Gait Comments R ankle pronation and subsequent pain                      Objective measurements completed on examination: See above findings.               PT Education - 07/26/20 1220    Education Details Pt educated on diagnosis, foot alignment, pillars of stability, prognosis, inserts, POC, HEP     Person(s) Educated Patient    Methods Explanation;Demonstration;Handout;Verbal cues;Tactile cues    Comprehension Verbalized understanding;Returned demonstration;Verbal cues required            PT Short Term Goals - 07/26/20 1241      PT SHORT TERM GOAL #1   Title Pt will be I and compliant with initial HEP.    Time 2    Period Weeks    Status New    Target Date 08/09/20      PT SHORT TERM GOAL #2   Title Pt will demonstrate appropriate footwear/insert with subsequent improved ability to walk.    Time 3    Period Weeks    Status New    Target Date 08/16/20             PT Long Term Goals - 07/26/20 1242      PT LONG TERM GOAL #1   Title Pt will be I and compliant with longterm HEP to manage condition and prevent reinjury.    Time 8    Period Weeks    Status New    Target Date 09/20/20      PT LONG TERM GOAL #2   Title Pt will increase R ankle MMT to 5/5 with hands on testing.    Baseline 4/5    Time 8    Period Weeks    Status New    Target Date 09/20/20      PT LONG TERM GOAL #3   Title Pt will decrease pain with walking by >50%, up to 3 miles, as needed for work and exercise.    Baseline 7-8/10    Time 8    Period Weeks    Status New    Target Date 09/20/20      PT LONG TERM GOAL #4   Title Pt will demo 25 SL heel raises on RLE pain free, in order to demonstrate significant improvement in strength.    Time 8    Period Weeks    Status New    Target Date 09/20/20  PT LONG TERM GOAL #5   Title Pt will decrease ankle FOTO limitation to </=26%, in order to demonstrate improvement in functional ability.    Baseline 37% limitation    Time 8    Period Weeks    Status New    Target Date 09/20/20                  Plan - 07/26/20 1230    Clinical Impression Statement Pt is a 49 yo female who presents to clinic with 1 year history of R foot/ankle pain. MRI negative for negative for any soft tissue/bony changes, but showed some small medial  joint effusion of unknown etiology. Pt diagnosed with posterior tibialis tendinitis with concurrent pain in medial leg and foot, but not over navicular. She demonstrates R foot/ankle pronation > L that increases with ambulation versus static standing. Pt reports increase in pain after ROM/MMT and walking assessments. She was educated thoroughly on footwear and support, suggesting OTC Sole or Superfeet orthotics to try, grading her wearing schedule to minimize increased discomfort. Pt additionally c/o pain in her R patellar tendon, likely from altered forces on knee from ankle pronation. She was educated on footwear, inserts, foot/ankle alignment, pillars of stability and importance of intrinsic foot strength, diagnosis, prognosis, POC, and HEP. She will benefit from skilled PT 1-2x/week for 6-8 weeks to reduce pain, improve alignment, increased strength, and increase activity level again.    Personal Factors and Comorbidities Past/Current Experience;Time since onset of injury/illness/exacerbation    Examination-Activity Limitations Lift;Squat;Stand;Stairs    Examination-Participation Restrictions Occupation;Community Activity;Shop;Interpersonal Relationship    Stability/Clinical Decision Making Stable/Uncomplicated    Clinical Decision Making Low    Rehab Potential Good    PT Frequency Other (comment)   1-2x/week   PT Duration Other (comment)   6-8 weeks   PT Treatment/Interventions ADLs/Self Care Home Management;Cryotherapy;Joint Manipulations;Electrical Stimulation;Iontophoresis 4mg /ml Dexamethasone;Moist Heat;Gait training;Stair training;Functional mobility training;Therapeutic activities;Neuromuscular re-education;Balance training;Therapeutic exercise;Patient/family education;Taping;Dry needling;Passive range of motion;Manual techniques;Vasopneumatic Device    PT Next Visit Plan Assess HEP response and footwear; manual as indicated (posterior TCJ mobs), gastroc/soleus flexibility, intrinsic foot/ankle  strength, HKA alignment and proximal stab    PT Home Exercise Plan NJFFPJLH: ALL seated towel scrunches, arch doming, heel/toe lift holds, and toe yoga/toe spreading and scrunching    Consulted and Agree with Plan of Care Patient           Patient will benefit from skilled therapeutic intervention in order to improve the following deficits and impairments:  Postural dysfunction, Improper body mechanics, Impaired perceived functional ability, Impaired flexibility, Increased fascial restricitons, Decreased strength, Decreased activity tolerance, Difficulty walking  Visit Diagnosis: Tibialis posterior tendinitis, right - Plan: PT plan of care cert/re-cert  Sprain of deltoid ligament of right ankle, initial encounter - Plan: PT plan of care cert/re-cert  Pronation of right foot - Plan: PT plan of care cert/re-cert  Muscle weakness (generalized) - Plan: PT plan of care cert/re-cert     Problem List Patient Active Problem List   Diagnosis Date Noted  . COVID-19 virus infection 03/14/2020  . Idiopathic scoliosis 07/18/2015  . ACUTE SINUSITIS, UNSPECIFIED 10/31/2008  . HERPES SIMPLEX INFECTION 01/19/2008  . ANAL FISSURE 01/19/2008  . IRRITABLE BOWEL SYNDROME 01/17/2008  . CHICKENPOX, HX OF 01/17/2008  . Esophageal reflux 12/30/2007  . HEMORRHOIDS, INTERNAL 11/15/2007    Izell Patterson, PT, DPT 07/26/2020, 12:52 PM  [facility] [address] [place], [place], [address] Phone: [phone]   Fax:  [phone]  Name: Kayla Shaw MRN: [id number] Date of Birth: 1970/11/12

## 2020-08-09 ENCOUNTER — Ambulatory Visit: Payer: BC Managed Care – PPO | Attending: Podiatry | Admitting: Physical Therapy

## 2020-08-09 ENCOUNTER — Encounter: Payer: Self-pay | Admitting: Physical Therapy

## 2020-08-09 ENCOUNTER — Other Ambulatory Visit: Payer: Self-pay

## 2020-08-09 DIAGNOSIS — M6281 Muscle weakness (generalized): Secondary | ICD-10-CM | POA: Insufficient documentation

## 2020-08-09 DIAGNOSIS — S93421A Sprain of deltoid ligament of right ankle, initial encounter: Secondary | ICD-10-CM

## 2020-08-09 DIAGNOSIS — M76821 Posterior tibial tendinitis, right leg: Secondary | ICD-10-CM | POA: Insufficient documentation

## 2020-08-09 DIAGNOSIS — M216X1 Other acquired deformities of right foot: Secondary | ICD-10-CM | POA: Insufficient documentation

## 2020-08-09 NOTE — Therapy (Signed)
[place], [place], [address] Phone: [phone]   Fax:  [phone]  Physical Therapy Treatment  Patient Details  Name: Kayla Shaw MRN: [id number] Date of Birth: 05/11/1971 Referring Provider (PT): Garrel Ridgel, [place]   Encounter Date: 08/09/2020   PT End of Session - 08/09/20 0851    Visit Number 2    Number of Visits 8    Date for PT Re-Evaluation 09/20/20    Authorization Type BCBS    PT Start Time 0845    PT Stop Time 0930    PT Time Calculation (min) 45 min           Past Medical History:  Diagnosis Date  . 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 GERD (gastroesophageal reflux disease)   . Irritable bowel syndrome (IBS)     Past Surgical History:  Procedure Laterality Date  . ANAL FISSURE REPAIR  2009    There were no vitals filed for this visit.   Subjective Assessment - 08/09/20 0849    Subjective Took meloxicam because I was in pain yesterday at 8/10, No pain today.    Currently in Pain? No/denies    Aggravating Factors  standing, walking lifting    Pain Relieving Factors rest, meds                             [facility] Adult PT Treatment/Exercise - 08/09/20 0001      Manual Therapy   Manual Therapy Joint mobilization;Soft tissue mobilization;Passive ROM    Joint Mobilization right ankle A/p and P/A talocrual mobs , metatarsal mobs right    Soft tissue mobilization right calf STW focuing posterir tibialis muscle belly     Passive ROM DF stretching prone and supine       Ankle Exercises: Stretches   Soleus Stretch 3 reps;30 seconds    Soleus Stretch Limitations with strap, long sitting verses feet on floor     Gastroc Stretch 3 reps;30 seconds    Gastroc Stretch Limitations with strap long sitting vs heel on floor       Ankle Exercises: Seated   Other Seated Ankle Exercises Toe yoga, toe preading, arch lifts, heel and toe raises     Other Seated Ankle  Exercises ialteral red band ankle eversion x 20                     PT Short Term Goals - 07/26/20 1241      PT SHORT TERM GOAL #1   Title Pt will be I and compliant with initial HEP.    Time 2    Period Weeks    Status New    Target Date 08/09/20      PT SHORT TERM GOAL #2   Title Pt will demonstrate appropriate footwear/insert with subsequent improved ability to walk.    Time 3    Period Weeks    Status New    Target Date 08/16/20             PT Long Term Goals - 07/26/20 1242      PT LONG TERM GOAL #1   Title Pt will be I and compliant with longterm HEP to manage condition and prevent reinjury.    Time 8    Period Weeks    Status New    Target Date 09/20/20      PT  LONG TERM GOAL #2   Title Pt will increase R ankle MMT to 5/5 with hands on testing.    Baseline 4/5    Time 8    Period Weeks    Status New    Target Date 09/20/20      PT LONG TERM GOAL #3   Title Pt will decrease pain with walking by >50%, up to 3 miles, as needed for work and exercise.    Baseline 7-8/10    Time 8    Period Weeks    Status New    Target Date 09/20/20      PT LONG TERM GOAL #4   Title Pt will demo 25 SL heel raises on RLE pain free, in order to demonstrate significant improvement in strength.    Time 8    Period Weeks    Status New    Target Date 09/20/20      PT LONG TERM GOAL #5   Title Pt will decrease ankle FOTO limitation to </=26%, in order to demonstrate improvement in functional ability.    Baseline 37% limitation    Time 8    Period Weeks    Status New    Target Date 09/20/20                 Plan - 08/09/20 0854    Clinical Impression Statement Pt reports min compliance with HEP due to son having strep throat. Reviewed entire HEP began seated gastroc and soleus stretching and manual STW and ankle mobs. Updated HEP.    PT Next Visit Plan Assess HEP response and footwear; manual as indicated (posterior TCJ mobs), gastroc/soleus flexibility,  intrinsic foot/ankle strength, HKA alignment and proximal stab    PT Home Exercise Plan NJFFPJLH: ALL seated towel scrunches, arch doming, heel/toe lift holds, and toe yoga/toe spreading and scrunching    Consulted and Agree with Plan of Care Patient           Patient will benefit from skilled therapeutic intervention in order to improve the following deficits and impairments:  Postural dysfunction, Improper body mechanics, Impaired perceived functional ability, Impaired flexibility, Increased fascial restricitons, Decreased strength, Decreased activity tolerance, Difficulty walking  Visit Diagnosis: Tibialis posterior tendinitis, right  Sprain of deltoid ligament of right ankle, initial encounter  Pronation of right foot  Muscle weakness (generalized)     Problem List Patient Active Problem List   Diagnosis Date Noted  . COVID-19 virus infection 03/14/2020  . Idiopathic scoliosis 07/18/2015  . ACUTE SINUSITIS, UNSPECIFIED 10/31/2008  . HERPES SIMPLEX INFECTION 01/19/2008  . ANAL FISSURE 01/19/2008  . IRRITABLE BOWEL SYNDROME 01/17/2008  . CHICKENPOX, HX OF 01/17/2008  . Esophageal reflux 12/30/2007  . HEMORRHOIDS, INTERNAL 11/15/2007    Dorene Ar, PTA 08/09/2020, 11:06 AM  [facility] [address] [place], [place], [address] Phone: [phone]   Fax:  [phone]  Name: LASHAUNDA SCHILD MRN: [id number] Date of Birth: December 07, 1970

## 2020-08-14 ENCOUNTER — Ambulatory Visit: Payer: BC Managed Care – PPO

## 2020-08-14 ENCOUNTER — Other Ambulatory Visit: Payer: Self-pay

## 2020-08-14 DIAGNOSIS — M216X1 Other acquired deformities of right foot: Secondary | ICD-10-CM

## 2020-08-14 DIAGNOSIS — M76821 Posterior tibial tendinitis, right leg: Secondary | ICD-10-CM | POA: Diagnosis not present

## 2020-08-14 DIAGNOSIS — S93421A Sprain of deltoid ligament of right ankle, initial encounter: Secondary | ICD-10-CM

## 2020-08-14 DIAGNOSIS — M6281 Muscle weakness (generalized): Secondary | ICD-10-CM

## 2020-08-14 NOTE — Therapy (Signed)
[place], [place], [address] Phone: [phone]   Fax:  [phone]  Physical Therapy Treatment  Patient Details  Name: Kayla Shaw MRN: [id number] Date of Birth: [birth year] Referring Provider (PT): Garrel Ridgel, [place]   Encounter Date: 08/14/2020   PT End of Session - 08/14/20 1051    Visit Number 3    Number of Visits 8    Date for PT Re-Evaluation 09/20/20    Authorization Type BCBS    PT Start Time 0800    PT Stop Time 0845    PT Time Calculation (min) 45 min    Activity Tolerance Patient tolerated treatment well    Behavior During Therapy [facility] for tasks assessed/performed           Past Medical History:  Diagnosis Date   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   Subjective Assessment - 08/14/20 0916    Subjective Pt reports she felt great after her last visit. Her pain level is down to a 5/10. She still wants to find the Sol insert, but could not find the one originally mentioned during eval.    Currently in Pain? Yes    Pain Score 5     Pain Location Ankle    Pain Orientation Right                                       PT Short Term Goals - 07/26/20 1241      PT SHORT TERM GOAL #1   Title Pt will be I and compliant with initial HEP.    Time 2    Period Weeks    Status New    Target Date 08/09/20      PT SHORT TERM GOAL #2   Title Pt will demonstrate appropriate footwear/insert with subsequent improved ability to walk.    Time 3    Period Weeks    Status New    Target Date 08/16/20             PT Long Term Goals - 07/26/20 1242      PT LONG TERM GOAL #1   Title Pt will be I and compliant with longterm HEP to manage condition and  prevent reinjury.    Time 8    Period Weeks    Status New    Target Date 09/20/20      PT LONG TERM GOAL #2   Title Pt will increase R ankle MMT to 5/5 with hands on testing.    Baseline 4/5    Time 8    Period Weeks    Status New    Target Date 09/20/20      PT LONG TERM GOAL #3   Title Pt will decrease pain with walking by >50%, up to 3 miles, as needed for work and exercise.    Baseline 7-8/10    Time 8    Period Weeks    Status New    Target Date 09/20/20      PT LONG TERM GOAL #4   Title Pt will demo 25 SL heel raises on RLE  pain free, in order to demonstrate significant improvement in strength.    Time 8    Period Weeks    Status New    Target Date 09/20/20      PT LONG TERM GOAL #5   Title Pt will decrease ankle FOTO limitation to </=26%, in order to demonstrate improvement in functional ability.    Baseline 37% limitation    Time 8    Period Weeks    Status New    Target Date 09/20/20                 Plan - 08/14/20 1052    Clinical Impression Statement Pt reports good compliance with HEP now, doing it throughout the day. She can only come 1x/week with work schedule, so stressed importance of continuing diligence and added knee to wall DF ankle mobs to combat for TCJ DF and towel inversion with 2# weight to continue to strengthen arch/posterior tib.    PT Next Visit Plan Assess HEP response and footwear; manual as indicated (posterior TCJ mobs), gastroc/soleus flexibility, intrinsic foot/ankle strength, HKA alignment and proximal stab, progress weight bearing for arch doming and try Pilates reformer footwork    PT Home Exercise Plan NJFFPJLH: ALL seated towel scrunches, arch doming, heel/toe lift holds, and toe yoga/toe spreading and scrunching    Consulted and Agree with Plan of Care Patient           Patient will benefit from skilled therapeutic intervention in order to improve the following deficits and impairments:  Postural dysfunction, Improper  body mechanics, Impaired perceived functional ability, Impaired flexibility, Increased fascial restricitons, Decreased strength, Decreased activity tolerance, Difficulty walking  Visit Diagnosis: Tibialis posterior tendinitis, right  Sprain of deltoid ligament of right ankle, initial encounter  Pronation of right foot  Muscle weakness (generalized)     Problem List Patient Active Problem List   Diagnosis Date Noted   COVID-19 virus infection 03/14/2020   Idiopathic scoliosis 07/18/2015   ACUTE SINUSITIS, UNSPECIFIED 10/31/2008   HERPES SIMPLEX INFECTION 01/19/2008   ANAL FISSURE 01/19/2008   IRRITABLE BOWEL SYNDROME 01/17/2008   CHICKENPOX, HX OF 01/17/2008   Esophageal reflux 12/30/2007   HEMORRHOIDS, INTERNAL 11/15/2007    Izell [place], PT, DPT 08/14/2020, 10:54 AM  [facility] [address] [place], [place], [address] Phone: [phone]   Fax:  [phone]  Name: Kayla Shaw MRN: [id number] Date of Birth: 1971/08/15

## 2020-08-17 ENCOUNTER — Ambulatory Visit: Payer: BC Managed Care – PPO

## 2020-08-20 ENCOUNTER — Ambulatory Visit: Payer: BC Managed Care – PPO | Admitting: Physical Therapy

## 2020-08-23 ENCOUNTER — Other Ambulatory Visit: Payer: Self-pay

## 2020-08-23 ENCOUNTER — Ambulatory Visit: Payer: BC Managed Care – PPO

## 2020-08-23 DIAGNOSIS — M76821 Posterior tibial tendinitis, right leg: Secondary | ICD-10-CM | POA: Diagnosis not present

## 2020-08-23 DIAGNOSIS — S93421A Sprain of deltoid ligament of right ankle, initial encounter: Secondary | ICD-10-CM

## 2020-08-23 DIAGNOSIS — M6281 Muscle weakness (generalized): Secondary | ICD-10-CM

## 2020-08-23 DIAGNOSIS — M216X1 Other acquired deformities of right foot: Secondary | ICD-10-CM

## 2020-08-23 NOTE — Therapy (Signed)
[place], [place], [address] Phone: [phone]   Fax:  [phone]  Physical Therapy Treatment  Patient Details  Name: Kayla Shaw MRN: [id number] Date of Birth: 09/02/1971 Referring Provider (PT): Garrel Ridgel, [place]   Encounter Date: 08/23/2020   PT End of Session - 08/23/20 0845    Visit Number 4    Number of Visits 8    Date for PT Re-Evaluation 09/20/20    Authorization Type BCBS    PT Start Time 0800    PT Stop Time 0842    PT Time Calculation (min) 42 min    Activity Tolerance Patient tolerated treatment well    Behavior During Therapy [facility] for tasks assessed/performed           Past Medical History:  Diagnosis Date   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   Subjective Assessment - 08/23/20 0805    Subjective Pt reports she has been feeling better and thinks the knee to wall has been helping.    Currently in Pain? Yes    Pain Score 3     Pain Location Ankle    Pain Orientation Right;Medial    Pain Descriptors / Indicators Sore                             [facility] Adult PT Treatment/Exercise - 08/23/20 0001      Manual Therapy   Manual Therapy Joint mobilization;Soft tissue mobilization;Myofascial release;Passive ROM    Manual therapy comments prone/supine    Joint Mobilization AP TCJ mobs grade 3-4    Soft tissue mobilization R lateral gastroc and posterior tibialias mid belly to superior    Myofascial Release MFR to mid belly posterior tibialis    Passive ROM DF knee bent and straight      Ankle Exercises: Seated   Other Seated Ankle Exercises Arch doming, + heel lifts, + INV/EV, heel lifts x 10 c 10#    Other Seated Ankle Exercises INV on towel 3# x 5       Ankle Exercises: Stretches   Plantar Fascia Stretch 2 reps;20 seconds    Soleus Stretch 2 reps;30 seconds    Soleus Stretch Limitations off platform    Gastroc Stretch 3 reps;30 seconds    Gastroc Stretch Limitations off platform    Other Stretch Knee to Wall DF (c washcloth towel roll under arch) mobs x 10 with arch dome,                     PT Short Term Goals - 07/26/20 1241      PT SHORT TERM GOAL #1   Title Pt will be I and compliant with initial HEP.    Time 2    Period Weeks    Status New    Target Date 08/09/20      PT SHORT TERM GOAL #2   Title Pt will demonstrate appropriate footwear/insert with subsequent improved ability to walk.    Time 3    Period Weeks    Status New    Target Date 08/16/20  PT Long Term Goals - 07/26/20 1242      PT LONG TERM GOAL #1   Title Pt will be I and compliant with longterm HEP to manage condition and prevent reinjury.    Time 8    Period Weeks    Status New    Target Date 09/20/20      PT LONG TERM GOAL #2   Title Pt will increase R ankle MMT to 5/5 with hands on testing.    Baseline 4/5    Time 8    Period Weeks    Status New    Target Date 09/20/20      PT LONG TERM GOAL #3   Title Pt will decrease pain with walking by >50%, up to 3 miles, as needed for work and exercise.    Baseline 7-8/10    Time 8    Period Weeks    Status New    Target Date 09/20/20      PT LONG TERM GOAL #4   Title Pt will demo 25 SL heel raises on RLE pain free, in order to demonstrate significant improvement in strength.    Time 8    Period Weeks    Status New    Target Date 09/20/20      PT LONG TERM GOAL #5   Title Pt will decrease ankle FOTO limitation to </=26%, in order to demonstrate improvement in functional ability.    Baseline 37% limitation    Time 8    Period Weeks    Status New    Target Date 09/20/20                 Plan - 08/23/20 0846    Clinical Impression Statement Pt  continues to make gradual progress with decrease in pain and improvement in arch support. Pt continues to collapse with ambulation without shoes and has increased pain. Added seat calf raise with 10# and increased inversion pull to 3#, as well as progressing gastroc-soleus S to standing off edge of platform.    PT Next Visit Plan Assess HEP response and footwear; manual as indicated (posterior TCJ mobs), gastroc/soleus flexibility, intrinsic foot/ankle strength, HKA alignment and proximal stab, progress weight bearing for arch doming and try Pilates reformer footwork    PT Home Exercise Plan NJFFPJLH: ALL seated towel scrunches, arch doming, heel/toe lift holds, and toe yoga/toe spreading and scrunching    Consulted and Agree with Plan of Care Patient           Patient will benefit from skilled therapeutic intervention in order to improve the following deficits and impairments:  Postural dysfunction, Improper body mechanics, Impaired perceived functional ability, Impaired flexibility, Increased fascial restricitons, Decreased strength, Decreased activity tolerance, Difficulty walking  Visit Diagnosis: Tibialis posterior tendinitis, right  Sprain of deltoid ligament of right ankle, initial encounter  Pronation of right foot  Muscle weakness (generalized)     Problem List Patient Active Problem List   Diagnosis Date Noted   COVID-19 virus infection 03/14/2020   Idiopathic scoliosis 07/18/2015   ACUTE SINUSITIS, UNSPECIFIED 10/31/2008   HERPES SIMPLEX INFECTION 01/19/2008   ANAL FISSURE 01/19/2008   IRRITABLE BOWEL SYNDROME 01/17/2008   CHICKENPOX, HX OF 01/17/2008   Esophageal reflux 12/30/2007   HEMORRHOIDS, INTERNAL 11/15/2007    Kayla Shaw, PT,DPT 08/23/2020, 8:49 AM  [facility] [address] [place], [place], [address] Phone: [phone]   Fax:  [phone]  Name: Kayla Shaw MRN:  [id number] Date of  Birth: 1971/05/24

## 2020-08-28 ENCOUNTER — Ambulatory Visit: Payer: BC Managed Care – PPO | Attending: Podiatry

## 2020-08-28 ENCOUNTER — Other Ambulatory Visit: Payer: Self-pay

## 2020-08-28 DIAGNOSIS — M6281 Muscle weakness (generalized): Secondary | ICD-10-CM | POA: Insufficient documentation

## 2020-08-28 DIAGNOSIS — S93421A Sprain of deltoid ligament of right ankle, initial encounter: Secondary | ICD-10-CM | POA: Diagnosis present

## 2020-08-28 DIAGNOSIS — M76821 Posterior tibial tendinitis, right leg: Secondary | ICD-10-CM | POA: Diagnosis not present

## 2020-08-28 DIAGNOSIS — M216X1 Other acquired deformities of right foot: Secondary | ICD-10-CM | POA: Diagnosis present

## 2020-08-28 NOTE — Therapy (Signed)
[place], [place], [address] Phone: [phone]   Fax:  [phone]  Physical Therapy Treatment  Patient Details  Name: Kayla Shaw MRN: [id number] Date of Birth: Sep 25, 1971 Referring Provider (PT): Garrel Ridgel, [place]   Encounter Date: 08/28/2020   PT End of Session - 08/28/20 0800    Visit Number 5    Number of Visits 8    Date for PT Re-Evaluation 09/20/20    Authorization Type BCBS    PT Start Time 0800    PT Stop Time 0842    PT Time Calculation (min) 42 min    Activity Tolerance Patient tolerated treatment well    Behavior During Therapy [facility] for tasks assessed/performed           Past Medical History:  Diagnosis Date  . 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 GERD (gastroesophageal reflux disease)   . Irritable bowel syndrome (IBS)     Past Surgical History:  Procedure Laterality Date  . ANAL FISSURE REPAIR  2009    There were no vitals filed for this visit.   Subjective Assessment - 08/28/20 0801    Subjective Pt reports overdoing it on Saturday with homecoming walking 6.5-7 miles. She is hurting this morning, but had been feeling.    Currently in Pain? Yes    Pain Score 6     Pain Location Ankle    Pain Orientation Right;Medial    Pain Descriptors / Indicators Sore                             [facility] Adult PT Treatment/Exercise - 08/28/20 0001      Exercises   Exercises Ankle      Manual Therapy   Manual Therapy Joint mobilization;Soft tissue mobilization;Myofascial release;Passive ROM    Manual therapy comments prone/supine    Joint Mobilization AP TCJ mobs grade 3-4    Soft tissue mobilization R lateral gastroc and posterior tibialias mid belly to superior    Myofascial Release MFR to mid belly posterior tibialis    Passive ROM DF knee bent and straight      Ankle Exercises: Stretches   Plantar Fascia Stretch 2 reps;20 seconds    Plantar Fascia  Stretch Limitations seated    Gastroc Stretch 3 reps;30 seconds    Gastroc Stretch Limitations with strap, long sitting      Ankle Exercises: Aerobic   Stationary Bike L3 5 minutes      Ankle Exercises: Seated   Towel Crunch 5 reps    Towel Inversion/Eversion 5 reps;Weights    Towel Inversion/Eversion Weights (lbs) INV 3#    Other Seated Ankle Exercises Arch doming, + heel lifts, + INV/EV                    PT Short Term Goals - 07/26/20 1241      PT SHORT TERM GOAL #1   Title Pt will be I and compliant with initial HEP.    Time 2    Period Weeks    Status New    Target Date 08/09/20      PT SHORT TERM GOAL #2   Title Pt will demonstrate appropriate footwear/insert with subsequent improved ability to walk.    Time 3    Period Weeks    Status New    Target Date 08/16/20  PT Long Term Goals - 07/26/20 1242      PT LONG TERM GOAL #1   Title Pt will be I and compliant with longterm HEP to manage condition and prevent reinjury.    Time 8    Period Weeks    Status New    Target Date 09/20/20      PT LONG TERM GOAL #2   Title Pt will increase R ankle MMT to 5/5 with hands on testing.    Baseline 4/5    Time 8    Period Weeks    Status New    Target Date 09/20/20      PT LONG TERM GOAL #3   Title Pt will decrease pain with walking by >50%, up to 3 miles, as needed for work and exercise.    Baseline 7-8/10    Time 8    Period Weeks    Status New    Target Date 09/20/20      PT LONG TERM GOAL #4   Title Pt will demo 25 SL heel raises on RLE pain free, in order to demonstrate significant improvement in strength.    Time 8    Period Weeks    Status New    Target Date 09/20/20      PT LONG TERM GOAL #5   Title Pt will decrease ankle FOTO limitation to </=26%, in order to demonstrate improvement in functional ability.    Baseline 37% limitation    Time 8    Period Weeks    Status New    Target Date 09/20/20                  Plan - 08/28/20 0800    Clinical Impression Statement Pt presents with increased pain levels after prolonged time on feet and walking 6+ miles this weekend for work. She did receive her Sole inserts and plans to begin wearing them tomorrow intermittently. Downgraded exercises today to sitting only with increased focus on STM to reduce pain.    PT Next Visit Plan Assess HEP response and footwear; manual as indicated (posterior TCJ mobs), gastroc/soleus flexibility, intrinsic foot/ankle strength, HKA alignment and proximal stab, progress weight bearing for arch doming and try Pilates reformer footwork    PT Home Exercise Plan NJFFPJLH: ALL seated towel scrunches, arch doming, heel/toe lift holds, and toe yoga/toe spreading and scrunching    Consulted and Agree with Plan of Care Patient           Patient will benefit from skilled therapeutic intervention in order to improve the following deficits and impairments:  Postural dysfunction, Improper body mechanics, Impaired perceived functional ability, Impaired flexibility, Increased fascial restricitons, Decreased strength, Decreased activity tolerance, Difficulty walking  Visit Diagnosis: Tibialis posterior tendinitis, right  Sprain of deltoid ligament of right ankle, initial encounter  Pronation of right foot  Muscle weakness (generalized)     Problem List Patient Active Problem List   Diagnosis Date Noted  . COVID-19 virus infection 03/14/2020  . Idiopathic scoliosis 07/18/2015  . ACUTE SINUSITIS, UNSPECIFIED 10/31/2008  . HERPES SIMPLEX INFECTION 01/19/2008  . ANAL FISSURE 01/19/2008  . IRRITABLE BOWEL SYNDROME 01/17/2008  . CHICKENPOX, HX OF 01/17/2008  . Esophageal reflux 12/30/2007  . HEMORRHOIDS, INTERNAL 11/15/2007    Izell [place], PT, DPT 08/28/2020, 8:51 AM  [facility] [address] [place], [place], [address] Phone: [phone]   Fax:  [phone]  Name: Kayla Shaw MRN: [id number] Date  of Birth: [birth year]

## 2020-08-30 ENCOUNTER — Encounter: Payer: BC Managed Care – PPO | Admitting: Physical Therapy

## 2020-09-04 ENCOUNTER — Other Ambulatory Visit: Payer: Self-pay

## 2020-09-04 ENCOUNTER — Ambulatory Visit: Payer: BC Managed Care – PPO

## 2020-09-04 DIAGNOSIS — M6281 Muscle weakness (generalized): Secondary | ICD-10-CM

## 2020-09-04 DIAGNOSIS — M76821 Posterior tibial tendinitis, right leg: Secondary | ICD-10-CM | POA: Diagnosis not present

## 2020-09-04 DIAGNOSIS — M216X1 Other acquired deformities of right foot: Secondary | ICD-10-CM

## 2020-09-04 DIAGNOSIS — S93421A Sprain of deltoid ligament of right ankle, initial encounter: Secondary | ICD-10-CM

## 2020-09-04 NOTE — Therapy (Addendum)
[place], [place], [address] Phone: [phone]   Fax:  [phone]  Physical Therapy Treatment/Discharge  Patient Details  Name: Kayla Shaw MRN: [id number] Date of Birth: Jun 09, 1971 Referring Provider (PT): Garrel Ridgel, [place]   Encounter Date: 09/04/2020   PT End of Session - 09/04/20 0807    Visit Number 6    Number of Visits 8    Date for PT Re-Evaluation 09/20/20    Authorization Type BCBS    PT Start Time 0800    PT Stop Time 0845    PT Time Calculation (min) 45 min    Activity Tolerance Patient tolerated treatment well    Behavior During Therapy [facility] for tasks assessed/performed           Past Medical History:  Diagnosis Date  . 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 GERD (gastroesophageal reflux disease)   . Irritable bowel syndrome (IBS)     Past Surgical History:  Procedure Laterality Date  . ANAL FISSURE REPAIR  2009    There were no vitals filed for this visit.   Subjective Assessment - 09/04/20 0805    Subjective Pt states she has been feeling better, just a little sore over the weekend. She picked up an ankle brace from [employer] this weekend that's been helping a lot.    Pain Score 3     Pain Location Ankle    Pain Orientation Right;Medial              [facility] PT Assessment - 09/04/20 0001      Observation/Other Assessments   Focus on Therapeutic Outcomes (FOTO)  33% limitation, 67% ability                         [facility] Adult PT Treatment/Exercise - 09/04/20 0001      Self-Care   Self-Care Other Self-Care Comments    Other Self-Care Comments  inserts in shoes and education about them, FOTO      Manual Therapy   Manual Therapy Joint mobilization;Soft tissue mobilization;Passive ROM    Manual therapy comments supine    Joint Mobilization rear/midfoot mobs    Soft tissue mobilization STM/DTM to entire posterior tib    Passive ROM knee straight DF  stretching      Ankle Exercises: Aerobic   Stationary Bike L3 5 minutes      Ankle Exercises: Seated   Other Seated Ankle Exercises Arch doming, + heel lifts, + INV/EV   5# for heel lift     Ankle Exercises: Stretches   Gastroc Stretch 3 reps;30 seconds    Gastroc Stretch Limitations prostretch at [employer] bar      Ankle Exercises: Standing   Other Standing Ankle Exercises arch doming 10x5", + small heel raise x 10, attempted SLS RLE but unable to maintain arch lift and put extra pressure on R knee    Other Standing Ankle Exercises Side stepping with RTB at feet x 1 lap 20'                    PT Short Term Goals - 09/04/20 1004      PT SHORT TERM GOAL #1   Title Pt will be I and compliant with initial HEP.    Status Achieved      PT SHORT TERM GOAL #2   Title Pt will demonstrate appropriate footwear/insert with subsequent improved ability to  walk.    Baseline CoSo inserts today with improve foot/ankle alignment during gait and increased comfort reported    Status Achieved             PT Long Term Goals - 09/04/20 1004      PT LONG TERM GOAL #1   Title Pt will be I and compliant with longterm HEP to manage condition and prevent reinjury.    Baseline have added to HEP progressively    Status On-going      PT LONG TERM GOAL #2   Title Pt will increase R ankle MMT to 5/5 with hands on testing.    Status On-going      PT LONG TERM GOAL #3   Title Pt will decrease pain with walking by >50%, up to 3 miles, as needed for work and exercise.    Baseline continues to get 6/10    Status On-going      PT LONG TERM GOAL #4   Title Pt will demo 25 SL heel raises on RLE pain free, in order to demonstrate significant improvement in strength.    Baseline NT today, due to inaility to perform SLS without compensations    Status On-going      PT LONG TERM GOAL #5   Title Pt will decrease ankle FOTO limitation to </=26%, in order to demonstrate improvement in functional ability.     Baseline 33% limitation    Status On-going                 Plan - 09/04/20 0807    Clinical Impression Statement Pt is making gradual progress toward goals with improvement in FOTO today to 67% ability from 63% at initial evaluation. Pt purchased CoSo insert that worked well today in shoes, pt reporting significant improvement in comfort. Performed all standing exercises with shoes on + inserts. Unable to perform R SLS without extra R knee pressure, so held today and focused on doming, small heel lifts and initiating side steps with RTB at feet. Provided band for home and educated on clams as well for hip strength.    PT Next Visit Plan Provide printout for clams and side steps, progress standing weight bearing (maybe SLS), Pilates footwork, assess HEP response and footwear PRN; manual as indicated (posterior TCJ mobs), gastroc/soleus flexibility, intrinsic foot/ankle strength, HKA alignment and proximal stab    PT Home Exercise Plan NJFFPJLH: ALL seated towel scrunches, arch doming, heel/toe lift holds, and toe yoga/toe spreading and scrunching    Consulted and Agree with Plan of Care Patient           Patient will benefit from skilled therapeutic intervention in order to improve the following deficits and impairments:  Postural dysfunction, Improper body mechanics, Impaired perceived functional ability, Impaired flexibility, Increased fascial restricitons, Decreased strength, Decreased activity tolerance, Difficulty walking  Visit Diagnosis: Tibialis posterior tendinitis, right  Sprain of deltoid ligament of right ankle, initial encounter  Pronation of right foot  Muscle weakness (generalized)     Problem List Patient Active Problem List   Diagnosis Date Noted  . COVID-19 virus infection 03/14/2020  . Idiopathic scoliosis 07/18/2015  . ACUTE SINUSITIS, UNSPECIFIED 10/31/2008  . HERPES SIMPLEX INFECTION 01/19/2008  . ANAL FISSURE 01/19/2008  . IRRITABLE BOWEL SYNDROME  01/17/2008  . CHICKENPOX, HX OF 01/17/2008  . Esophageal reflux 12/30/2007  . HEMORRHOIDS, INTERNAL 11/15/2007    Izell Lemitar, PT, DPT 09/04/2020, 10:07 AM   [facility] [phone]  [place], [place], [address] Phone: [phone]   Fax:  [phone]  Name: Kayla Shaw MRN: [id number] Date of Birth: [birth year]   PHYSICAL THERAPY DISCHARGE SUMMARY  Visits from Start of Care: 6  Current functional level related to goals / functional outcomes: Moderate   Remaining deficits: Continued pain and compromised arch   Education / Equipment: HEP, bands  Plan: Patient agrees to discharge.  Patient goals were partially met. Patient is being discharged due to not returning since the last visit.  ?????          Phill Myron. Yvette Rack, PT, DPT

## 2020-09-16 ENCOUNTER — Other Ambulatory Visit: Payer: Self-pay | Admitting: Family Medicine

## 2020-09-25 ENCOUNTER — Ambulatory Visit: Payer: BC Managed Care – PPO

## 2020-09-26 ENCOUNTER — Ambulatory Visit: Payer: BC Managed Care – PPO | Admitting: Podiatry

## 2020-09-26 ENCOUNTER — Other Ambulatory Visit: Payer: Self-pay

## 2020-09-26 ENCOUNTER — Encounter: Payer: Self-pay | Admitting: Podiatry

## 2020-09-26 DIAGNOSIS — M76822 Posterior tibial tendinitis, left leg: Secondary | ICD-10-CM

## 2020-09-26 DIAGNOSIS — M76821 Posterior tibial tendinitis, right leg: Secondary | ICD-10-CM

## 2020-09-26 MED ORDER — TRIAMCINOLONE ACETONIDE 40 MG/ML IJ SUSP
20.0000 mg | Freq: Once | INTRAMUSCULAR | Status: AC
  Administered 2020-09-26: 20 mg

## 2020-09-26 NOTE — Progress Notes (Signed)
She presents today for follow-up of her posterior tibial tendinitis of her right foot.  States that after physical therapy was doing well for a few days and then began to hurt again.  She states that is on and off she says some days it is impossible to walk on other days it hardly bad at all.  Objective: Vital signs are stable she is alert oriented x3 she has tenderness on palpation of the posterior tibial tendon there is a small amount of fluctuance just inferior to the medial malleolus right.  The tendon itself I am able to feel the margins so there is not a lot of swelling.  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  Plan: At this point due to the MRI read as no tear and a tendinitis I feel that orthotics are necessary.  So when he had injected a small amount of dexamethasone today at the area of fluctuance.  She was casted today for orthotics.  I will follow-up with her about 3 weeks after she receives them.

## 2020-10-21 ENCOUNTER — Other Ambulatory Visit: Payer: Self-pay | Admitting: Podiatry

## 2020-10-31 ENCOUNTER — Ambulatory Visit: Payer: BC Managed Care – PPO | Admitting: Podiatry

## 2020-10-31 ENCOUNTER — Encounter: Payer: Self-pay | Admitting: Podiatry

## 2020-10-31 ENCOUNTER — Ambulatory Visit: Payer: BC Managed Care – PPO | Admitting: Orthotics

## 2020-10-31 ENCOUNTER — Other Ambulatory Visit: Payer: Self-pay

## 2020-10-31 DIAGNOSIS — M76821 Posterior tibial tendinitis, right leg: Secondary | ICD-10-CM

## 2020-10-31 DIAGNOSIS — M722 Plantar fascial fibromatosis: Secondary | ICD-10-CM

## 2020-10-31 NOTE — Progress Notes (Signed)
She presents today for follow-up of her posterior tibial tendinitis after physical therapy.  States that she is doing much better still little tender but not like it was.  She is also here today to pick up orthotics.  Objective: Vital signs are stable she is alert and oriented x3.  Pulses are palpable.  No more fluctuance beneath the posterior tibial tendon.  She has good inversion against resistance.  Assessment: Well-healing posterior tibial tendinitis.  Plan: At this point would not put her in a set of orthotics today and I will follow-up with her in about 1 month to make sure she is doing well.  I also encouraged her to continue her anti-inflammatories.

## 2020-10-31 NOTE — Progress Notes (Signed)
Patient picked up f/o and was pleased with fit, comfort, and function.  Worked well with footwear.  Told of rbeak in period and how to report any issues.  

## 2020-11-02 ENCOUNTER — Ambulatory Visit: Payer: BC Managed Care – PPO | Attending: Family

## 2020-11-02 DIAGNOSIS — Z23 Encounter for immunization: Secondary | ICD-10-CM

## 2020-11-05 DIAGNOSIS — E785 Hyperlipidemia, unspecified: Secondary | ICD-10-CM | POA: Insufficient documentation

## 2020-11-29 ENCOUNTER — Other Ambulatory Visit: Payer: Self-pay | Admitting: Family Medicine

## 2020-11-30 ENCOUNTER — Ambulatory Visit: Payer: BC Managed Care – PPO | Attending: Family

## 2020-11-30 DIAGNOSIS — Z23 Encounter for immunization: Secondary | ICD-10-CM

## 2020-12-12 ENCOUNTER — Encounter: Payer: Self-pay | Admitting: Podiatry

## 2020-12-12 ENCOUNTER — Other Ambulatory Visit: Payer: Self-pay

## 2020-12-12 ENCOUNTER — Ambulatory Visit: Payer: BC Managed Care – PPO | Admitting: Podiatry

## 2020-12-12 DIAGNOSIS — M76821 Posterior tibial tendinitis, right leg: Secondary | ICD-10-CM | POA: Diagnosis not present

## 2020-12-12 MED ORDER — NITROGLYCERIN 0.2 MG/HR TD PT24: 0.2000 mg | MEDICATED_PATCH | Freq: Every day | TRANSDERMAL | 12 refills | Status: DC

## 2020-12-12 MED ORDER — TRIAMCINOLONE ACETONIDE 40 MG/ML IJ SUSP
20.0000 mg | Freq: Once | INTRAMUSCULAR | Status: AC
  Administered 2020-12-12: 20 mg

## 2020-12-12 NOTE — Progress Notes (Signed)
She presents today for follow-up of her posterior tibial tendinitis states that everything seems to work for a while and then it flares back up again. States that she just got her orthotics seems to help for a while but started become painful again.  Objective: Vital signs are stable alert oriented x3. Pulses are palpable. She has tenderness on palpation of the posterior tibial tendon just before the insertion of the navicular. She has some fluctuance in this area as well.  Assessment: Posterior tibial tendinitis.  Plan: At this point I highly recommended dexamethasone 2 mg to the point of fluctuance. Also recommended that she get back into her orthotics immediately and wear doing good shoes at all times follow-up with me and about a month.

## 2021-01-09 ENCOUNTER — Encounter: Payer: Self-pay | Admitting: *Deleted

## 2021-01-09 ENCOUNTER — Ambulatory Visit: Payer: BC Managed Care – PPO | Admitting: Podiatry

## 2021-01-09 ENCOUNTER — Other Ambulatory Visit: Payer: Self-pay

## 2021-01-09 ENCOUNTER — Encounter: Payer: Self-pay | Admitting: Podiatry

## 2021-01-09 DIAGNOSIS — M66871 Spontaneous rupture of other tendons, right ankle and foot: Secondary | ICD-10-CM | POA: Diagnosis not present

## 2021-01-09 NOTE — Progress Notes (Signed)
She presents today with absolutely no improvement to the right medial ankle.  States that some days are better than other days but it never really goes away states that is still there and has been there for more than a year now.  States that it is just seems to be getting worse.  Objective: Vital signs are stable alert oriented x3.  There is no erythema cellulitis drainage or odor she does have edema along the medial aspect of the ankle just inferior to the medial malleolus.  She has tenderness on palpation of the medial malleolus itself as well as the posterior tibial tendon and the retinaculum.  Previous MRI taken last year demonstrates no significant findings.  Assessment: Worsening posterior tibial tendinitis capsulitis.  Plan: Probable tear of the posterior tibial tendon are the retinaculum at the very least.  Chronic pain.  Requesting for differential diagnosis and surgical consideration

## 2021-01-23 ENCOUNTER — Ambulatory Visit
Admission: RE | Admit: 2021-01-23 | Discharge: 2021-01-23 | Disposition: A | Discharge status: Home / Self Care | Payer: BC Managed Care – PPO | Source: Ambulatory Visit | Attending: Podiatry | Admitting: Podiatry

## 2021-01-23 DIAGNOSIS — M66871 Spontaneous rupture of other tendons, right ankle and foot: Secondary | ICD-10-CM

## 2021-01-28 ENCOUNTER — Telehealth: Payer: Self-pay | Admitting: *Deleted

## 2021-01-28 NOTE — Telephone Encounter (Signed)
-----   Message from Garrel Ridgel, [place] sent at 01/23/2021  5:20 PM EDT ----- Please send for over read and inform patient of delay.

## 2021-01-28 NOTE — Telephone Encounter (Signed)
Sent fax to [facility] requesting copy of MRI to be sent to [place].

## 2021-01-28 NOTE — Telephone Encounter (Signed)
Informed patient MRI would be sent for overread and would contact her as soon as the results come in.

## 2021-02-04 NOTE — Telephone Encounter (Signed)
Received MRI disc-sent out to [employer] today

## 2021-02-20 ENCOUNTER — Encounter: Payer: Self-pay | Admitting: *Deleted

## 2021-02-20 NOTE — Telephone Encounter (Signed)
Received overread report-will contact patient once Dr. Milinda Pointer reviews

## 2021-02-24 ENCOUNTER — Other Ambulatory Visit: Payer: Self-pay | Admitting: Podiatry

## 2021-02-25 NOTE — Telephone Encounter (Signed)
Please advise 

## 2021-02-27 NOTE — Progress Notes (Signed)
   Covid-19 Vaccination Clinic  Name:  Kayla Shaw    MRN: [id number] DOB: Jan 15, 1971  02/27/2021  Kayla Shaw was observed post Covid-19 immunization for 15 minutes without incident. She was provided with Vaccine Information Sheet and instruction to access the V-Safe system.   Kayla Shaw was instructed to call 911 with any severe reactions post vaccine: Marland Kitchen Difficulty breathing  . Swelling of face and throat  . A fast heartbeat  . A bad rash all over body  . Dizziness and weakness   Immunizations Administered    Name Date Dose VIS Date Route   Moderna COVID-19 Vaccine 11/02/2020  9:45 AM 0.5 mL 08/15/2020 Intramuscular   Manufacturer: Moderna   Lot: [id number]   [place]: 29798-921-19

## 2021-03-04 ENCOUNTER — Encounter: Payer: Self-pay | Admitting: Podiatry

## 2021-03-04 ENCOUNTER — Other Ambulatory Visit: Payer: Self-pay

## 2021-03-04 ENCOUNTER — Ambulatory Visit: Payer: BC Managed Care – PPO | Admitting: Podiatry

## 2021-03-04 DIAGNOSIS — M7751 Other enthesopathy of right foot: Secondary | ICD-10-CM

## 2021-03-04 MED ORDER — DEXAMETHASONE SODIUM PHOSPHATE 120 MG/30ML IJ SOLN
2.0000 mg | Freq: Once | INTRAMUSCULAR | Status: AC
  Administered 2021-03-04: 2 mg via INTRA_ARTICULAR

## 2021-03-04 NOTE — Progress Notes (Signed)
She presents today for follow-up of her posterior tibial tendinitis and the pain that is associated with it just around the medial malleolus.  She states that is still painful it does not seem to be getting any better.  Objective: Vital signs are stable she is alert oriented x3 pulses are palpable.  MRI relates only posterior tibial tendinitis but the majority of her pain is in small area of fluctuance on the inferior most aspect of the medial malleolus where there is a small amount of fluid collection this palpable.  It appears to be coming off of the retinaculum and deltoid ligament or the posterior tibial tendon area possibly even a small ganglion not picked up by the MRI.  Assessment: Bursitis capsulitis posterior tibial tendinitis.  Plan: At this point I injected just 2 mg of dexamethasone local anesthetic to the point of maximal tenderness causing the area to blow up like a balloon and it was kind of tender for her.  I would like to follow-up with her in about 6 weeks to make sure she is doing well if she has not improved we will schedule surgical intervention consisting of exploration of the posterior tibial tendon as well as the area in question at the inferior aspect of the malleolus.

## 2021-03-18 ENCOUNTER — Ambulatory Visit: Payer: BC Managed Care – PPO | Admitting: Podiatry

## 2021-03-18 ENCOUNTER — Encounter: Payer: Self-pay | Admitting: Podiatry

## 2021-03-18 ENCOUNTER — Other Ambulatory Visit: Payer: Self-pay

## 2021-03-18 DIAGNOSIS — M67471 Ganglion, right ankle and foot: Secondary | ICD-10-CM | POA: Diagnosis not present

## 2021-03-18 DIAGNOSIS — M7751 Other enthesopathy of right foot: Secondary | ICD-10-CM | POA: Diagnosis not present

## 2021-03-18 DIAGNOSIS — M6240 Contracture of muscle, unspecified site: Secondary | ICD-10-CM | POA: Diagnosis not present

## 2021-03-18 NOTE — Progress Notes (Signed)
She presents today for surgical consult regarding her right ankle.  States that just seems to be getting worse all the time she is ready to have something done.  Objective: Pulses are palpable according to the MRI is only tendinosis.  She has a soft tissue area just beneath the margin of the medial malleolus that is exquisitely tender for her.  She also has tenderness along the course of the posterior tibial tendon from posterior superior aspect of the medial malleolus extending to the navicular tuberosity.  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  Also cannot rule out a tear of the tendon itself.  Plan: Consented her today for a evaluation posterior tibial tendon and excision of soft tissue mass.  She understands and is amenable to it does understand that there could be complications which may include but not limited to postop pain bleeding swelling fashion recurrence need for further surgery overcorrection under correction loss of digit loss of limb loss of life.

## 2021-03-19 ENCOUNTER — Telehealth: Payer: Self-pay | Admitting: Urology

## 2021-03-19 NOTE — Telephone Encounter (Signed)
DOS - 03/29/21  EXC GAGLION/ TUMOR RIGHT --- 28090 REPAIR POST TIBIAL TENDON RIGHT --- 25498   BCBS EFFECTIVE DATE - 10/27/20  PLAN DEDUCTIBLE - $1,250.00 W/ $417.76 to go  OUT OF POCKET - $4,890.00 W/  $3,706.28 to go COINSURANCE - 20 %  COPAY - $0.00   NO PRIOR AUTH REQUIRED

## 2021-03-20 NOTE — Progress Notes (Signed)
   Covid-19 Vaccination Clinic  Name:  Kayla Shaw    MRN: [id number] DOB: 07-27-1971  03/20/2021  [name] was observed post Covid-19 immunization for 15 minutes without incident. She was provided with Vaccine Information Sheet and instruction to access the V-Safe system.   [name] was instructed to call 911 with any severe reactions post vaccine: Marland Kitchen Difficulty breathing  . Swelling of face and throat  . A fast heartbeat  . A bad rash all over body  . Dizziness and weakness   Immunizations Administered    Name Date Dose VIS Date Route   Moderna COVID-19 Vaccine 11/30/2020 11:00 AM 0.5 mL 08/15/2020 Intramuscular   Manufacturer: Levan Hurst   Lot: [id number]   [place]: [id number]

## 2021-03-27 ENCOUNTER — Other Ambulatory Visit: Payer: Self-pay | Admitting: Podiatry

## 2021-03-27 MED ORDER — CEPHALEXIN 500 MG PO CAPS: 500.0000 mg | ORAL_CAPSULE | Freq: Three times a day (TID) | ORAL | 0 refills | Status: DC

## 2021-03-27 MED ORDER — OXYCODONE-ACETAMINOPHEN 10-325 MG PO TABS: 1.0000 | ORAL_TABLET | Freq: Three times a day (TID) | ORAL | 0 refills | Status: AC | PRN

## 2021-03-27 MED ORDER — ONDANSETRON HCL 4 MG PO TABS: 4.0000 mg | ORAL_TABLET | Freq: Three times a day (TID) | ORAL | 0 refills | Status: DC | PRN

## 2021-03-29 DIAGNOSIS — M66871 Spontaneous rupture of other tendons, right ankle and foot: Secondary | ICD-10-CM | POA: Diagnosis not present

## 2021-03-29 HISTORY — PX: ANKLE SURGERY: SHX546

## 2021-04-03 ENCOUNTER — Encounter: Payer: Self-pay | Admitting: Podiatry

## 2021-04-03 ENCOUNTER — Ambulatory Visit (INDEPENDENT_AMBULATORY_CARE_PROVIDER_SITE_OTHER): Payer: BC Managed Care – PPO | Admitting: Podiatry

## 2021-04-03 ENCOUNTER — Other Ambulatory Visit: Payer: Self-pay

## 2021-04-03 VITALS — BP 116/73 | HR 78 | Temp 97.2°F

## 2021-04-03 DIAGNOSIS — M7751 Other enthesopathy of right foot: Secondary | ICD-10-CM

## 2021-04-03 DIAGNOSIS — Z9889 Other specified postprocedural states: Secondary | ICD-10-CM

## 2021-04-03 DIAGNOSIS — M76821 Posterior tibial tendinitis, right leg: Secondary | ICD-10-CM

## 2021-04-03 NOTE — Progress Notes (Signed)
She presents today for her postop visit date of surgery is 03/29/2021 primary repair posterior tibial tendon with excision of mass distal to the medial malleolus.  She states that is been doing good a little painful at times but she continues to utilize her crutches.  She denies fever chills nausea vomiting muscle aches pains calf pain back pain chest pain shortness of breath.  Objective: Vital signs are stable alert oriented x3.  Pulses are palpable.  Dry sterile dressing intact once removed demonstrates mild erythema along the medial incision no cellulitis drainage or odor.  She has dorsiflexion and plantarflexion.  Sutures are intact margins are well coapting.  Assessment: Well-healing surgical foot.  Plan: Redressed today dressed a compressive dressing placed her back in her cam walker continue nonweightbearing status follow-up with me in another week.

## 2021-04-10 ENCOUNTER — Other Ambulatory Visit: Payer: Self-pay

## 2021-04-10 ENCOUNTER — Ambulatory Visit (INDEPENDENT_AMBULATORY_CARE_PROVIDER_SITE_OTHER): Payer: BC Managed Care – PPO | Admitting: Podiatry

## 2021-04-10 ENCOUNTER — Encounter: Payer: Self-pay | Admitting: Podiatry

## 2021-04-10 VITALS — Temp 97.8°F

## 2021-04-10 DIAGNOSIS — Z9889 Other specified postprocedural states: Secondary | ICD-10-CM

## 2021-04-10 NOTE — Progress Notes (Signed)
She presents today for second postop visit status post repair deltoid ligament excision bone cyst medial ankle.  She is doing really well.  States that she had some burning in the bandage felt a little tight.  She does relate to having had the foot in a dependent position more so recently because she is working again.  She states she is working from home.  Objective: Dressed her dressing intact was removed demonstrates no erythema there is mild mottling around the incision site in her medial ankle.  Range of motion is good but she states that it is stiff.  Assessment well-healing surgical foot.  Plan: We will leave the stitches in for another week and redressed today with a dry sterile compressive dressing a little Betadine over the incision site.  Continue use of the cam boot nonweightbearing.

## 2021-04-17 ENCOUNTER — Encounter: Payer: Self-pay | Admitting: Podiatry

## 2021-04-17 ENCOUNTER — Other Ambulatory Visit: Payer: Self-pay

## 2021-04-17 ENCOUNTER — Ambulatory Visit (INDEPENDENT_AMBULATORY_CARE_PROVIDER_SITE_OTHER): Payer: BC Managed Care – PPO | Admitting: Podiatry

## 2021-04-17 DIAGNOSIS — Z9889 Other specified postprocedural states: Secondary | ICD-10-CM

## 2021-04-17 DIAGNOSIS — M7751 Other enthesopathy of right foot: Secondary | ICD-10-CM

## 2021-04-17 DIAGNOSIS — M76821 Posterior tibial tendinitis, right leg: Secondary | ICD-10-CM

## 2021-04-17 DIAGNOSIS — M6240 Contracture of muscle, unspecified site: Secondary | ICD-10-CM

## 2021-04-17 NOTE — Progress Notes (Signed)
She presents today for a she presents today postop visit number first 3 date of surgery 03/29/2021.  She states that she is doing quite well after medial ankle repair including posterior tibial tendon.  Objective: Presents today CAM Walker intact was removed demonstrates sutures are intact minimal edema no erythema cellulitis drainage or odor.  Sutures were removed.  She has good range of motion of the foot dorsiflexion plantarflexion inversion and eversion.  Assessment: Well-healing surgical foot.  Plan: Redressed with a compression bandage today we will start utilizing compression anklet over the next week or so.  Allow her to start getting this wet but still nonweightbearing for another 2 weeks.  She will then progress to partial weightbearing in 2 weeks and then to full weightbearing after that.  I will follow-up with her in 2 to 3 weeks.

## 2021-04-24 ENCOUNTER — Encounter: Payer: BC Managed Care – PPO | Admitting: Podiatry

## 2021-05-01 ENCOUNTER — Ambulatory Visit: Payer: BC Managed Care – PPO | Admitting: Podiatry

## 2021-05-08 ENCOUNTER — Ambulatory Visit (INDEPENDENT_AMBULATORY_CARE_PROVIDER_SITE_OTHER): Payer: BC Managed Care – PPO | Admitting: Podiatry

## 2021-05-08 ENCOUNTER — Encounter: Payer: Self-pay | Admitting: Podiatry

## 2021-05-08 ENCOUNTER — Other Ambulatory Visit: Payer: Self-pay

## 2021-05-08 DIAGNOSIS — M76821 Posterior tibial tendinitis, right leg: Secondary | ICD-10-CM

## 2021-05-08 DIAGNOSIS — Z9889 Other specified postprocedural states: Secondary | ICD-10-CM

## 2021-05-08 NOTE — Progress Notes (Signed)
  Subjective:  Patient ID: Kayla Shaw, female    DOB: [birth year],  MRN: [id number]  Chief Complaint  Patient presents with   Routine Post Op    POV #4 DOS 03/29/2021 PRIMARY REPAIR POSTERIOR TIBIAL TENDON, EXCISION MASS DISTAL MEDIAL MALLEOLUS, POSS CAST RT FOOT/DR HYAT PT     50 y.o. female returns for post-op check.  Doing well she still has some tenderness but is improving, she been able to put partial weight on it in the boot and with crutches  Review of Systems: Negative except as noted in the HPI. Denies N/V/F/Ch.   Objective:  There were no vitals filed for this visit. There is no height or weight on file to calculate BMI. Constitutional Well developed. Well nourished.  Vascular Foot warm and well perfused. Capillary refill normal to all digits.   Neurologic Normal speech. Oriented to person, place, and time. Epicritic sensation to light touch grossly present bilaterally.  Dermatologic Skin healing well without signs of infection. Skin edges well coapted without signs of infection.  Orthopedic: Tenderness to palpation noted about the surgical site.    Assessment:   1. Posterior tibial tendinitis of right lower extremity   2. Post-operative state    Plan:  Patient was evaluated and treated and all questions answered.  S/p foot surgery -Doing well encouraged her to begin to transition in 1 week to full weightbearing as tolerated.  Only in the CAM boot.  Also discussed use of a vitamin E lotion or silicone gel along the scar to improve tenderness and thickness along the scar.  She will return in 3 weeks and likely transition to shoe gear at that time.  Ace wrap applied for compression.  Return in about 3 weeks (around 05/29/2021).

## 2021-05-27 ENCOUNTER — Ambulatory Visit: Payer: BC Managed Care – PPO | Admitting: Podiatry

## 2021-05-29 ENCOUNTER — Ambulatory Visit (INDEPENDENT_AMBULATORY_CARE_PROVIDER_SITE_OTHER): Payer: BC Managed Care – PPO | Admitting: Podiatry

## 2021-05-29 ENCOUNTER — Encounter: Payer: Self-pay | Admitting: Podiatry

## 2021-05-29 ENCOUNTER — Other Ambulatory Visit: Payer: Self-pay

## 2021-05-29 DIAGNOSIS — Z9889 Other specified postprocedural states: Secondary | ICD-10-CM

## 2021-05-29 DIAGNOSIS — M76821 Posterior tibial tendinitis, right leg: Secondary | ICD-10-CM

## 2021-05-29 DIAGNOSIS — M7751 Other enthesopathy of right foot: Secondary | ICD-10-CM

## 2021-05-29 NOTE — Progress Notes (Signed)
She presents today date of surgery 03/29/2021 primary repair of posterior tibial tendon and deltoid ligament.  She states that she is doing pretty well she continues to use her crutches.  Objective: Vital signs are stable she is alert oriented x3.  Pulses are palpable.  Minimal swelling at the surgical site right.  There are some scar tissue present.  Minimal  Assessment: Well-healing surgical foot.  Plan: We will allow her to start utilizing her tennis shoe and her crutches for partial weightbearing progressing on to full weightbearing.  She will be allowed to go to work since she sits and I will follow-up with her in about 3 to 4 weeks.

## 2021-06-24 ENCOUNTER — Other Ambulatory Visit: Payer: Self-pay

## 2021-06-24 ENCOUNTER — Ambulatory Visit (INDEPENDENT_AMBULATORY_CARE_PROVIDER_SITE_OTHER): Payer: BC Managed Care – PPO | Admitting: Podiatry

## 2021-06-24 ENCOUNTER — Encounter: Payer: Self-pay | Admitting: Podiatry

## 2021-06-24 DIAGNOSIS — M76821 Posterior tibial tendinitis, right leg: Secondary | ICD-10-CM

## 2021-06-24 DIAGNOSIS — Z9889 Other specified postprocedural states: Secondary | ICD-10-CM

## 2021-06-24 DIAGNOSIS — M7751 Other enthesopathy of right foot: Secondary | ICD-10-CM

## 2021-06-24 NOTE — Progress Notes (Signed)
She presents today for repair of her deltoid ligament date of surgery 03/29/2021 states that is still tender I will be running any marathons anytime soon but is getting better all the time.  Mildly tender along the incision particularly distal aspect.  Objective: Vital signs are stable alert and x3.  Pulses are palpable.  There is no erythema edema cellulitis drainage or odor incision site appears to be healing very nicely.  Mild tenderness on deep palpation of this area.  Range of motion is good and nontender.  Assessment: Well-healing surgical foot.  Plan: We will allow her to get back to her regular routine follow-up with me on an as-needed basis.

## 2021-09-02 ENCOUNTER — Other Ambulatory Visit: Payer: Self-pay | Admitting: Obstetrics and Gynecology

## 2021-09-02 DIAGNOSIS — Z1231 Encounter for screening mammogram for malignant neoplasm of breast: Secondary | ICD-10-CM

## 2021-10-08 ENCOUNTER — Ambulatory Visit
Admission: RE | Admit: 2021-10-08 | Discharge: 2021-10-08 | Disposition: A | Discharge status: Home / Self Care | Payer: BC Managed Care – PPO | Source: Ambulatory Visit | Attending: Obstetrics and Gynecology | Admitting: Obstetrics and Gynecology

## 2021-10-08 DIAGNOSIS — Z1231 Encounter for screening mammogram for malignant neoplasm of breast: Secondary | ICD-10-CM

## 2021-12-02 ENCOUNTER — Encounter: Payer: Self-pay | Admitting: Gastroenterology

## 2021-12-18 ENCOUNTER — Ambulatory Visit (AMBULATORY_SURGERY_CENTER): Payer: BC Managed Care – PPO | Admitting: *Deleted

## 2021-12-18 ENCOUNTER — Other Ambulatory Visit: Payer: Self-pay

## 2021-12-18 VITALS — Ht 62.0 in | Wt 143.0 lb

## 2021-12-18 DIAGNOSIS — Z1211 Encounter for screening for malignant neoplasm of colon: Secondary | ICD-10-CM

## 2021-12-18 NOTE — Progress Notes (Signed)

## 2021-12-26 ENCOUNTER — Encounter: Payer: Self-pay | Admitting: Gastroenterology

## 2022-01-01 ENCOUNTER — Encounter: Payer: Self-pay | Admitting: Gastroenterology

## 2022-01-01 ENCOUNTER — Ambulatory Visit (AMBULATORY_SURGERY_CENTER): Payer: BC Managed Care – PPO | Admitting: Gastroenterology

## 2022-01-01 VITALS — BP 110/84 | HR 75 | Temp 96.6°F | Resp 12 | Ht 61.0 in | Wt 143.0 lb

## 2022-01-01 DIAGNOSIS — Z8719 Personal history of other diseases of the digestive system: Secondary | ICD-10-CM | POA: Diagnosis not present

## 2022-01-01 DIAGNOSIS — K635 Polyp of colon: Secondary | ICD-10-CM | POA: Diagnosis not present

## 2022-01-01 DIAGNOSIS — D120 Benign neoplasm of cecum: Secondary | ICD-10-CM | POA: Diagnosis not present

## 2022-01-01 DIAGNOSIS — Z1211 Encounter for screening for malignant neoplasm of colon: Secondary | ICD-10-CM

## 2022-01-01 HISTORY — PX: COLONOSCOPY: SHX174

## 2022-01-01 MED ORDER — SODIUM CHLORIDE 0.9 % IV SOLN: 500.0000 mL | Freq: Once | INTRAVENOUS | Status: DC

## 2022-01-01 NOTE — Patient Instructions (Addendum)
Thank you for allowing [place] to care for you today. ?Resume previous diet and medications today. ?Recommend high fiber diet and adding FiberCon 1-2 tablets daily. ?Return to normal daily activities tomorrow. ?Await biopsies and await further recommendations at that time. ? ? ?Follow-up appointment scheduled with Dr Rush Landmark.  Tuesday 02/04/2022 arrive @ 11:20 3rd floor. ? ?YOU HAD AN ENDOSCOPIC PROCEDURE TODAY AT [facility]:   Refer to the procedure report that was given to you for any specific questions about what was found during the examination.  If the procedure report does not answer your questions, please call your gastroenterologist to clarify.  If you requested that your care partner not be given the details of your procedure findings, then the procedure report has been included in a sealed envelope for you to review at your convenience later. ? ?YOU SHOULD EXPECT: Some feelings of bloating in the abdomen. Passage of more gas than usual.  Walking can help get rid of the air that was put into your GI tract during the procedure and reduce the bloating. If you had a lower endoscopy (such as a colonoscopy or flexible sigmoidoscopy) you may notice spotting of blood in your stool or on the toilet paper. If you underwent a bowel prep for your procedure, you may not have a normal bowel movement for a few days. ? ?Please Note:  You might notice some irritation and congestion in your nose or some drainage.  This is from the oxygen used during your procedure.  There is no need for concern and it should clear up in a day or so. ? ?SYMPTOMS TO REPORT IMMEDIATELY: ? ?Following lower endoscopy (colonoscopy or flexible sigmoidoscopy): ? Excessive amounts of blood in the stool ? Significant tenderness or worsening of abdominal pains ? Swelling of the abdomen that is new, acute ? Fever of 100?F or higher ? ? ?For urgent or emergent issues, a gastroenterologist can be reached at any hour by calling [phone]. ?Do not use MyChart messaging for urgent concerns.  ? ? ?DIET:  We do recommend a small meal at first, but then you may proceed to your regular diet.  Drink plenty of fluids but you should avoid alcoholic beverages for 24 hours. ? ?ACTIVITY:  You should plan to take it easy for the rest of today and you should NOT DRIVE or use heavy machinery until tomorrow (because of the sedation medicines used during the test).   ? ?FOLLOW UP: ?Our staff will call the number listed on your records 48-72 hours following your procedure to check on you and address any questions or concerns that you may have regarding the information given to you following your procedure. If we do not reach you, we will leave a message.  We will attempt to reach you two times.  During this call, we will ask if you have developed any symptoms of COVID 19. If you develop any symptoms (ie: fever, flu-like symptoms, shortness of breath, cough etc.) before then, please call [phone].  If you test positive for Covid 19 in the 2 weeks post procedure, please call and report this information to [place].   ? ?If any biopsies were taken you will be contacted by phone or by letter within the next 1-3 weeks.  Please call us at [phone] if you have not heard about the biopsies in 3 weeks.  ? ? ?SIGNATURES/CONFIDENTIALITY: ?You and/or your care partner have signed paperwork which will be entered into your electronic medical record.  These signatures attest to the fact that that the information above on your After Visit Summary has been reviewed and is understood.  Full responsibility of the confidentiality of this discharge information lies with you and/or your care-partner.  ?

## 2022-01-01 NOTE — Progress Notes (Signed)
VS  DT ? ?Pt's states no medical or surgical changes since previsit or office visit. ? ?

## 2022-01-01 NOTE — Progress Notes (Signed)
Called to room to assist during endoscopic procedure.  Patient ID and intended procedure confirmed with present staff. Received instructions for my participation in the procedure from the performing physician.  

## 2022-01-01 NOTE — Progress Notes (Signed)
Report to PACU, RN, vss, BBS= Clear.  

## 2022-01-01 NOTE — Progress Notes (Signed)
? ?GASTROENTEROLOGY PROCEDURE H&P NOTE  ? ?Primary Care Physician: ?Laurey Morale, MD ? ?HPI: ?Kayla Shaw is a 51 y.o. female who presents for colonoscopy for screening. ? ?Past Medical History:  ?Diagnosis Date  ? Arthritis   ? roght ankle- surgery 2022  ? 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 GERD (gastroesophageal reflux disease)   ? Hyperlipidemia   ? Irritable bowel syndrome (IBS)   ? past hx  ? ?Past Surgical History:  ?Procedure Laterality Date  ? ANAL FISSURE REPAIR  10/28/2007  ? ANKLE SURGERY Right 03/29/2021  ? SIGMOIDOSCOPY  2009  ? WISDOM TOOTH EXTRACTION    ? ?Current Outpatient Medications  ?Medication Sig Dispense Refill  ? APPLE CIDER VINEGAR PO Take by mouth. ACV Goli gummies    ? cyclobenzaprine (FLEXERIL) 5 MG tablet Take by mouth.    ? levonorgestrel (MIRENA) 20 MCG/24HR IUD 1 each by Intrauterine route once.    ? escitalopram (LEXAPRO) 10 MG tablet TAKE 1 TABLET(10 MG) BY MOUTH DAILY (Patient not taking: Reported on 12/18/2021) 30 tablet 2  ? valACYclovir (VALTREX) 1000 MG tablet Take 1,000 mg by mouth as needed.     ? ?Current Facility-Administered Medications  ?Medication Dose Route Frequency Provider Last Rate Last Admin  ? 0.9 %  sodium chloride infusion  500 mL Intravenous Once Mansouraty, Telford Nab., MD      ? ? ?Current Outpatient Medications:  ?  APPLE CIDER VINEGAR PO, Take by mouth. ACV Goli gummies, Disp: , Rfl:  ?  cyclobenzaprine (FLEXERIL) 5 MG tablet, Take by mouth., Disp: , Rfl:  ?  levonorgestrel (MIRENA) 20 MCG/24HR IUD, 1 each by Intrauterine route once., Disp: , Rfl:  ?  escitalopram (LEXAPRO) 10 MG tablet, TAKE 1 TABLET(10 MG) BY MOUTH DAILY (Patient not taking: Reported on 12/18/2021), Disp: 30 tablet, Rfl: 2 ?  valACYclovir (VALTREX) 1000 MG tablet, Take 1,000 mg by mouth as needed. , Disp: , Rfl:  ? ?Current Facility-Administered Medications:  ?  0.9 %  sodium chloride infusion, 500 mL, Intravenous, Once, Mansouraty, Telford Nab., MD ?Allergies  ?Allergen  Reactions  ? Succinylsulphathiazole   ? Sulfamethoxazole-Trimethoprim   ? Septra [Sulfamethoxazole-Trimethoprim] Rash and Other (See Comments)  ?  Muscle pain & hot flashes  ? Sulfamethoxazole   ?  Other reaction(s): Other ?Body Aches  ? ?Family History  ?Problem Relation Age of Onset  ? Diabetes Mother   ? Hypertension Mother   ? Diabetes Father   ? Hypertension Father   ? Stroke Father   ? Anesthesia problems Neg Hx   ? Colon cancer Neg Hx   ? Colon polyps Neg Hx   ? Esophageal cancer Neg Hx   ? Stomach cancer Neg Hx   ? Rectal cancer Neg Hx   ? ?Social History  ? ?Socioeconomic History  ? Marital status: Divorced  ?  Spouse name: Not on file  ? Number of children: Not on file  ? Years of education: Not on file  ? Highest education level: Not on file  ?Occupational History  ? Not on file  ?Tobacco Use  ? Smoking status: Never  ? Smokeless tobacco: Never  ?Substance and Sexual Activity  ? Alcohol use: Yes  ?  Alcohol/week: 2.0 standard drinks  ?  Types: 2 Standard drinks or equivalent per week  ?  Comment: A DAY  ? Drug use: No  ? Sexual activity: Yes  ?Other Topics Concern  ? Not on file  ?Social History  Narrative  ? Not on file  ? ?Social Determinants of Health  ? ?Financial Resource Strain: Not on file  ?Food Insecurity: Not on file  ?Transportation Needs: Not on file  ?Physical Activity: Not on file  ?Stress: Not on file  ?Social Connections: Not on file  ?Intimate Partner Violence: Not on file  ? ? ?Physical Exam: ?Today's Vitals  ? 01/01/22 1240 01/01/22 1241  ?BP: 103/68   ?Pulse: 84   ?Temp: (!) 96.6 ?F (35.9 ?C) (!) 96.6 ?F (35.9 ?C)  ?SpO2: 100%   ?Weight: 143 lb (64.9 kg)   ?Height: '5\' 1"'$  (1.549 m)   ? ?Body mass index is 27.02 kg/m?. ?GEN: NAD ?EYE: Sclerae anicteric ?ENT: MMM ?CV: Non-tachycardic ?GI: Soft, NT/ND ?NEURO:  Alert & Oriented x 3 ? ?Lab Results: ?No results for input(s): WBC, HGB, HCT, PLT in the last 72 hours. ?BMET ?No results for input(s): NA, K, CL, CO2, GLUCOSE, BUN, CREATININE,  CALCIUM in the last 72 hours. ?LFT ?No results for input(s): PROT, ALBUMIN, AST, ALT, ALKPHOS, BILITOT, BILIDIR, IBILI in the last 72 hours. ?PT/INR ?No results for input(s): LABPROT, INR in the last 72 hours. ? ? ?Impression / Plan: ?This is a 51 y.o.female who presents for colonoscopy for screening. ? ?The risks and benefits of endoscopic evaluation/treatment were discussed with the patient and/or family; these include but are not limited to the risk of perforation, infection, bleeding, missed lesions, lack of diagnosis, severe illness requiring hospitalization, as well as anesthesia and sedation related illnesses.  The patient's history has been reviewed, patient examined, no change in status, and deemed stable for procedure.  The patient and/or family is agreeable to proceed.  ? ? ?Justice Britain, MD ?[facility] ?Advanced Endoscopy ?Office # [phone] ? ?

## 2022-01-01 NOTE — Op Note (Signed)
[place] ?Patient Name: Kayla Shaw ?Procedure Date: 01/01/2022 1:28 PM ?MRN: [id number] ?Endoscopist: Justice Britain , MD ?Age: 51 ?Referring MD:  ?Date of Birth: 1971-02-21 ?Gender: Female ?Account #: [id number] ?Procedure:                Colonoscopy ?Indications:              Screening for colorectal malignant neoplasm,  ?                          Previous history of IBS per chart ?Medicines:                Monitored Anesthesia Care ?Procedure:                Pre-Anesthesia Assessment: ?                          - Prior to the procedure, a History and Physical  ?                          was performed, and patient medications and  ?                          allergies were reviewed. The patient's tolerance of  ?                          previous anesthesia was also reviewed. The risks  ?                          and benefits of the procedure and the sedation  ?                          options and risks were discussed with the patient.  ?                          All questions were answered, and informed consent  ?                          was obtained. Prior Anticoagulants: The patient has  ?                          taken no previous anticoagulant or antiplatelet  ?                          agents. ASA Grade Assessment: II - A patient with  ?                          mild systemic disease. After reviewing the risks  ?                          and benefits, the patient was deemed in  ?                          satisfactory condition to undergo the procedure. ?  After obtaining informed consent, the colonoscope  ?                          was passed under direct vision. Throughout the  ?                          procedure, the patient's blood pressure, pulse, and  ?                          oxygen saturations were monitored continuously. The  ?                          PCF-HQ190L Colonoscope was introduced through the  ?                          anus and advanced to the 10 cm  into the ileum. The  ?                          colonoscopy was performed without difficulty. The  ?                          patient tolerated the procedure. The quality of the  ?                          bowel preparation was good. The terminal ileum,  ?                          ileocecal valve, appendiceal orifice, and rectum  ?                          were photographed. ?Scope In: 1:42:27 PM ?Scope Out: 2:00:45 PM ?Scope Withdrawal Time: 0 hours 15 minutes 10 seconds  ?Total Procedure Duration: 0 hours 18 minutes 18 seconds  ?Findings:                 The digital rectal exam findings include  ?                          hemorrhoids. Pertinent negatives include no  ?                          palpable rectal lesions. ?                          The terminal ileum appeared normal. Biopsies were  ?                          taken with a cold forceps for histology to rule out  ?                          chronic ileitis. ?                          The ileocecal valve was congested. ?  A medium fistula was found in the proximal  ?                          ascending colon extending into the terminal ileum.  ?                          This is in close proximity to the true ICV and I  ?                          can peak through the fistula into the TI and into  ?                          the colon through this area. ?                          A 3 mm polyp was found in the cecum. The polyp was  ?                          sessile. The polyp was removed with a cold snare.  ?                          Resection and retrieval were complete. ?                          A localized area of granular mucosa was found in  ?                          the cecum. ?                          Otherwise, normal mucosa was found in the rectum,  ?                          in the left colon and in the right colon. Biopsies  ?                          were taken with a cold forceps for histology from  ?                           the Right colon for chronic colitis rule out.  ?                          Biopsies were taken with a cold forceps for  ?                          histology from the Left colon for chronic colitis  ?                          rule out. Biopsies were taken with a cold forceps  ?                          for histology from the rectum  for chronic proctitis  ?                          rule out. ?                          Non-bleeding non-thrombosed internal hemorrhoids  ?                          were found during retroflexion, during perianal  ?                          exam and during digital exam. The hemorrhoids were  ?                          Grade II (internal hemorrhoids that prolapse but  ?                          reduce spontaneously). ?Complications:            No immediate complications. ?Estimated Blood Loss:     Estimated blood loss was minimal. ?Impression:               - Hemorrhoids found on digital rectal exam. ?                          - The examined portion of the terminal ileum was  ?                          normal. Biopsied. ?                          - Congested mucosa at the ileocecal valve. ?                          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                          terminal ileum. ?                          - One 3 mm polyp in the cecum, removed with a cold  ?                          snare. Resected and retrieved. ?                          - Granularity in the cecum. Otherwise normal mucosa  ?                          in the rectum, in the left colon and in the right  ?                          colon. Biopsied Rt/Lt/Rectum to rule out chronic  ?  inflammation. ?                          - Non-bleeding non-thrombosed internal hemorrhoids. ?Recommendation:           - The patient will be observed post-procedure,  ?                          until all discharge criteria are met. ?                          - Discharge patient to home. ?                           - Patient has a contact number available for  ?                          emergencies. The signs and symptoms of potential  ?                          delayed complications were discussed with the  ?                          patient. Return to normal activities tomorrow.  ?                          Written discharge instructions were provided to the  ?                          patient. ?                          - High fiber diet. ?                          - Use FiberCon 1-2 tablets PO daily. ?                          - Continue present medications. ?                          - Await pathology results. ?                          - Repeat colonoscopy for surveillance based on  ?                          pathology results. ?                          - Recommend MR-Enterography v CT-Enterography to  ?                          further evaluate if other issues noted in the small  ?                          bowel. ?                          -  If issues of diarrhea occur in future, then  ?                          consider Cholestyramine as there could be bile-salt  ?                          diarrhea as a result of dysfunctional ICV v fistula  ?                          from TI to Colon that may allow more loss of bile  ?                          salts. ?                          - The findings and recommendations were discussed  ?                          with the patient. ?                          - The findings and recommendations were discussed  ?                          with the patient's family. ?Justice Britain, MD ?01/01/2022 2:11:57 PM ?

## 2022-01-03 ENCOUNTER — Telehealth: Payer: Self-pay

## 2022-01-03 NOTE — Telephone Encounter (Signed)
?  Follow up Call- ? ?Call back number 01/01/2022  ?Post procedure Call Back phone  # [phone]  ?Permission to leave phone message Yes  ?Some recent data might be hidden  ?  ? ?Patient questions: ? ?Do you have a fever, pain , or abdominal swelling? No. ?Pain Score  0 * ? ?Have you tolerated food without any problems? Yes.   ? ?Have you been able to return to your normal activities? Yes.   ? ?Do you have any questions about your discharge instructions: ?Diet   No. ?Medications  No. ?Follow up visit  No. ? ?Do you have questions or concerns about your Care? No. ? ?Actions: ?* If pain score is 4 or above: ?No action needed, pain <4. ? ? ?

## 2022-01-08 ENCOUNTER — Encounter: Payer: Self-pay | Admitting: Gastroenterology

## 2022-02-04 ENCOUNTER — Encounter: Payer: Self-pay | Admitting: Gastroenterology

## 2022-02-04 ENCOUNTER — Ambulatory Visit: Payer: BC Managed Care – PPO | Admitting: Gastroenterology

## 2022-02-04 VITALS — BP 124/66 | HR 84 | Ht 62.0 in | Wt 144.0 lb

## 2022-02-04 DIAGNOSIS — R933 Abnormal findings on diagnostic imaging of other parts of digestive tract: Secondary | ICD-10-CM

## 2022-02-04 DIAGNOSIS — Z8719 Personal history of other diseases of the digestive system: Secondary | ICD-10-CM

## 2022-02-04 DIAGNOSIS — K632 Fistula of intestine: Secondary | ICD-10-CM

## 2022-02-04 NOTE — Patient Instructions (Signed)
If symptoms of diarrhea  or pain become persistent let us know. Otherwise follow- up as needed.  ? ?If you are age 51 or older, your body mass index should be between 23-30. Your Body mass index is 26.34 kg/m?Marland Kitchen If this is out of the aforementioned range listed, please consider follow up with your Primary Care Provider. ? ?If you are age 48 or younger, your body mass index should be between 19-25. Your Body mass index is 26.34 kg/m?Marland Kitchen If this is out of the aformentioned range listed, please consider follow up with your Primary Care Provider.  ? ?________________________________________________________ ? ?The [facility] providers would like to encourage you to use [facility] to communicate with providers for non-urgent requests or questions.  Due to long hold times on the telephone, sending your provider a message by [facility] may be a faster and more efficient way to get a response.  Please allow 48 business hours for a response.  Please remember that this is for non-urgent requests.  ?_______________________________________________________ ? ?Thank you for choosing me and [facility]. ? ?Dr. Rush Landmark ? ?

## 2022-02-07 ENCOUNTER — Encounter: Payer: Self-pay | Admitting: Gastroenterology

## 2022-02-07 DIAGNOSIS — K632 Fistula of intestine: Secondary | ICD-10-CM | POA: Insufficient documentation

## 2022-02-07 DIAGNOSIS — Z8719 Personal history of other diseases of the digestive system: Secondary | ICD-10-CM | POA: Insufficient documentation

## 2022-02-07 DIAGNOSIS — R933 Abnormal findings on diagnostic imaging of other parts of digestive tract: Secondary | ICD-10-CM | POA: Insufficient documentation

## 2022-02-07 NOTE — Progress Notes (Signed)
? ?GASTROENTEROLOGY OUTPATIENT CLINIC VISIT  ? ?Primary Care Provider ?Laurey Morale, MD ?[address] ?[place] [address] ?[phone] ? ? ?Patient Profile: ?Kayla Shaw is a 51 y.o. female with a pmh significant for MDD/anxiety, previous anal fissure, previous IBS, Colo-ICV fistula.  The patient presents to the [facility] for an evaluation and management of problem(s) noted below: ? ?Problem List ?1. Abnormal colonoscopy   ?2. Entero-colonic fistula   ?3. History of IBS   ? ? ?History of Present Illness ?This is a patient that I met for a recent screening colonoscopy.  She had an interesting finding at her ileocecum of a fistula.  Biopsies of her terminal ileum as well as her right and left colon and rectum did not show any evidence of Crohn's disease or colitis.  In the long past, she used to deal with issues of diarrhea as well as pain and bloating.  None of these are significant issues for her at this time.  She has a regular bowel movement on a normal basis.  She does not recall a history of significant nonsteroidal use in the past and none currently.  She has not aches experiencing any abdominal pain or discomfort.  She has not had an alteration in her bowel habits otherwise currently. ? ?GI Review of Systems ?Positive as above ?Negative for pyrosis, dysphagia, odynophagia, nausea, vomiting, melena, hematochezia ? ?Review of Systems ?General: Denies fevers/chills/weight loss unintentionally ?Cardiovascular: Denies chest pain ?Pulmonary: Denies shortness of breath ?Gastroenterological: See HPI ?Genitourinary: Denies darkened urine ?Hematological: Denies easy bruising/bleeding ?Endocrine: Denies temperature intolerance ?Dermatological: Denies jaundice ?Psychological: Mood is stable ? ? ?Medications ?Current Outpatient Medications  ?Medication Sig Dispense Refill  ? APPLE CIDER VINEGAR PO Take by mouth. ACV Goli gummies    ? cyclobenzaprine (FLEXERIL) 5 MG tablet Take by  mouth.    ? escitalopram (LEXAPRO) 10 MG tablet TAKE 1 TABLET(10 MG) BY MOUTH DAILY 30 tablet 2  ? levonorgestrel (MIRENA) 20 MCG/24HR IUD 1 each by Intrauterine route once.    ? valACYclovir (VALTREX) 1000 MG tablet Take 1,000 mg by mouth as needed.     ? ?No current facility-administered medications for this visit.  ? ? ?Allergies ?Allergies  ?Allergen Reactions  ? Succinylsulphathiazole   ? Sulfamethoxazole-Trimethoprim   ? Septra [Sulfamethoxazole-Trimethoprim] Rash and Other (See Comments)  ?  Muscle pain & hot flashes  ? Sulfamethoxazole   ?  Other reaction(s): Other ?Body Aches  ? ? ?Histories ?Past Medical History:  ?Diagnosis Date  ? Arthritis   ? roght ankle- surgery 2022  ? 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 GERD (gastroesophageal reflux disease)   ? Hyperlipidemia   ? Irritable bowel syndrome (IBS)   ? past hx  ? ?Past Surgical History:  ?Procedure Laterality Date  ? ANAL FISSURE REPAIR  10/28/2007  ? ANKLE SURGERY Right 03/29/2021  ? SIGMOIDOSCOPY  2009  ? WISDOM TOOTH EXTRACTION    ? ?Social History  ? ?Socioeconomic History  ? Marital status: Divorced  ?  Spouse name: Not on file  ? Number of children: Not on file  ? Years of education: Not on file  ? Highest education level: Not on file  ?Occupational History  ? Not on file  ?Tobacco Use  ? Smoking status: Never  ? Smokeless tobacco: Never  ?Substance and Sexual Activity  ? Alcohol use: Yes  ?  Alcohol/week: 2.0 standard drinks  ?  Types: 2 Standard drinks or equivalent per  week  ?  Comment: A DAY  ? Drug use: No  ? Sexual activity: Yes  ?Other Topics Concern  ? Not on file  ?Social History Narrative  ? Not on file  ? ?Social Determinants of Health  ? ?Financial Resource Strain: Not on file  ?Food Insecurity: Not on file  ?Transportation Needs: Not on file  ?Physical Activity: Not on file  ?Stress: Not on file  ?Social Connections: Not on file  ?Intimate Partner Violence: Not on file  ? ?Family History  ?Problem Relation Age of Onset  ?  Diabetes Mother   ? Hypertension Mother   ? Diabetes Father   ? Hypertension Father   ? Stroke Father   ? Anesthesia problems Neg Hx   ? Colon cancer Neg Hx   ? Colon polyps Neg Hx   ? Esophageal cancer Neg Hx   ? Stomach cancer Neg Hx   ? Rectal cancer Neg Hx   ? Inflammatory bowel disease Neg Hx   ? Liver disease Neg Hx   ? Pancreatic cancer Neg Hx   ? ?I have reviewed her medical, social, and family history in detail and updated the electronic medical record as necessary.  ? ? ?PHYSICAL EXAMINATION  ?BP 124/66   Pulse 84   Ht _0  (1.575 m)   Wt 144 lb (65.3 kg)   BMI 26.34 kg/m?  ?Wt Readings from Last 3 Encounters:  ?02/04/22 144 lb (65.3 kg)  ?01/01/22 143 lb (64.9 kg)  ?12/18/21 143 lb (64.9 kg)  ?GEN: NAD, appears stated age, doesn't appear chronically ill ?PSYCH: Cooperative, without pressured speech ?EYE: Conjunctivae pink, sclerae anicteric ?ENT: MMM ?CV: Nontachycardic ?RESP: No audible wheezing ?GI: NABS, soft, NT/ND, without rebound or guarding, no HSM appreciated ?MSK/EXT: No lower extremity edema ?SKIN: No jaundice ?NEURO:  Alert & Oriented x 3, no focal deficits ? ? ?REVIEW OF DATA  ?I reviewed the following data at the time of this encounter: ? ?GI Procedures and Studies  ?March 2023 colonoscopy ?- Hemorrhoids found on digital rectal exam. ?- The examined portion of the terminal ileum was normal. Biopsied. ?- Congested mucosa at the ileocecal valve. 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 ?- One 3 mm polyp in the cecum, removed with a cold snare. Resected and retrieved. ?- Granularity in the cecum. Otherwise normal mucosa in the rectum, in the left colon and in the right colon. Biopsied Rt/Lt/Rectum to rule out chronic inflammation. ?- Non-bleeding non-thrombosed internal hemorrhoids. ? ?Pathology ?Diagnosis ?1. Surgical [P], colon, cecum, polyp (1) ?- BENIGN MESENCHYMAL POLYP ?2. Surgical [P], right colon biopsies ?- COLONIC MUCOSA WITH NO SPECIFIC HISTOPATHOLOGIC  CHANGES ?- NEGATIVE FOR ACUTE INFLAMMATION, FEATURES OF CHRONICITY, GRANULOMAS OR DYSPLASIA ?3. Surgical [P], small bowel, terminal ileum biopsies ?- ILEAL MUCOSA WITH NO SPECIFIC HISTOPATHOLOGIC CHANGES ?- NEGATIVE FOR ACUTE INFLAMMATION, FEATURES OF CHRONICITY OR GRANULOMAS ?4. Surgical [P], left colon biopsies ?- COLONIC MUCOSA WITH NO SPECIFIC HISTOPATHOLOGIC CHANGES ?- NEGATIVE FOR ACUTE INFLAMMATION, FEATURES OF CHRONICITY, GRANULOMAS OR DYSPLASIA ?5. Surgical [P], colon, rectum ?- COLONIC MUCOSA WITH NO SPECIFIC HISTOPATHOLOGIC CHANGES ?- NEGATIVE FOR ACUTE INFLAMMATION, FEATURES OF CHRONICITY, GRANULOMAS OR DYSPLASIA ? ?Laboratory Studies  ?Reviewed those in epic ? ?Imaging Studies  ?No relevant studies to review ? ? ?ASSESSMENT  ?Ms. Cleckley is a 51 y.o. female with a pmh significant for MDD/anxiety, previous anal fissure, previous IBS, Colo-ICV fistula.  The patient is seen today for evaluation and management of: ? ?1. Abnormal colonoscopy   ?  2. Entero-colonic fistula   ?3. History of IBS   ? ?The patient is clinically and hemodynamically stable.  Years ago she was experiencing issues of irritable bowel syndrome diarrhea but that has not been an issue for her for years.  She has regular bowel habits at this time.  She is not experiencing any other significant GI issues.  She is certainly at risk of the possibility of bile salt diarrhea and small intestine bacterial overgrowth based on the findings at her colonoscopy.  As she is not having significant symptoms otherwise, we can hold off on an enterography (CT or MRI) at this time.  Certainly if in the future the patient develops significant issues or other issues in regards to diarrhea or bowel habit changes or abdominal pain or discomfort we will consider enterography and further work-up.  Otherwise I will see the patient back for a 10-year colonoscopy as the polyp that was removed returned as it was a mesenchymal benign polyp.  All patient questions were  answered to the best of my ability, and the patient agrees to the aforementioned plan of action with follow-up as indicated. ? ? ?PLAN  ?Continue colonoscopy for now ?If patient develops abdominal symptoms in the

## 2022-04-01 ENCOUNTER — Encounter: Payer: Self-pay | Admitting: Family Medicine

## 2022-04-01 ENCOUNTER — Telehealth: Payer: BC Managed Care – PPO | Admitting: Family Medicine

## 2022-04-01 DIAGNOSIS — J0100 Acute maxillary sinusitis, unspecified: Secondary | ICD-10-CM | POA: Diagnosis not present

## 2022-04-01 MED ORDER — AMOXICILLIN-POT CLAVULANATE 875-125 MG PO TABS: 1.0000 | ORAL_TABLET | Freq: Two times a day (BID) | ORAL | 0 refills | Status: DC

## 2022-04-01 NOTE — Progress Notes (Signed)
Subjective:    Patient ID: Kayla Shaw, female    DOB: 1970-12-07, 51 y.o.   MRN: [id number]  HPI Virtual Visit via Video Note  I connected with the patient on 04/01/22 at  2:45 PM EDT by a video enabled telemedicine application and verified that I am speaking with the correct person using two identifiers.  Location patient: home Location provider:work or home office Persons participating in the virtual visit: patient, provider  I discussed the limitations of evaluation and management by telemedicine and the availability of in person appointments. The patient expressed understanding and agreed to proceed.   HPI: Here for one week of sinus pressure, pressure int he upper teeth, PND, and a dry cough. No fever or SOB. Using Flonase and Zyrtec.    ROS: See pertinent positives and negatives per HPI.  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   right upper thigh   GERD (gastroesophageal reflux disease)    Hyperlipidemia    Irritable bowel syndrome (IBS)    past hx    Past Surgical History:  Procedure Laterality Date   ANAL FISSURE REPAIR  10/28/2007   ANKLE SURGERY Right 03/29/2021   SIGMOIDOSCOPY  2009   WISDOM TOOTH EXTRACTION      Family History  Problem Relation Age of Onset   Diabetes Mother    Hypertension Mother    Diabetes Father    Hypertension Father    Stroke Father    Anesthesia problems Neg Hx    Colon cancer Neg Hx    Colon polyps Neg Hx    Esophageal cancer Neg Hx    Stomach cancer Neg Hx    Rectal cancer Neg Hx    Inflammatory bowel disease Neg Hx    Liver disease Neg Hx    Pancreatic cancer Neg Hx      Current Outpatient Medications:    amoxicillin-clavulanate (AUGMENTIN) 875-125 MG tablet, Take 1 tablet by mouth 2 (two) times daily., Disp: 20 tablet, Rfl: 0   APPLE CIDER VINEGAR PO, Take by mouth. ACV Goli gummies, Disp: , Rfl:    cyclobenzaprine (FLEXERIL) 5 MG tablet, Take by  mouth., Disp: , Rfl:    escitalopram (LEXAPRO) 10 MG tablet, TAKE 1 TABLET(10 MG) BY MOUTH DAILY, Disp: 30 tablet, Rfl: 2   levonorgestrel (MIRENA) 20 MCG/24HR IUD, 1 each by Intrauterine route once., Disp: , Rfl:    valACYclovir (VALTREX) 1000 MG tablet, Take 1,000 mg by mouth as needed. , Disp: , Rfl:   EXAM:  VITALS per patient if applicable:  GENERAL: alert, oriented, appears well and in no acute distress  HEENT: atraumatic, conjunttiva clear, no obvious abnormalities on inspection of external nose and ears  NECK: normal movements of the head and neck  LUNGS: on inspection no signs of respiratory distress, breathing rate appears normal, no obvious gross SOB, gasping or wheezing  CV: no obvious cyanosis  MS: moves all visible extremities without noticeable abnormality  PSYCH/NEURO: pleasant and cooperative, no obvious depression or anxiety, speech and thought processing grossly intact  ASSESSMENT AND PLAN: Sinusitis, treat with 10 days of Augmentin. Alysia Penna, MD  Discussed the following assessment and plan:  No diagnosis found.     I discussed the assessment and treatment plan with the patient. The patient was provided an opportunity to ask questions and all were answered. The patient agreed with the plan and demonstrated an understanding of the instructions.  The patient was advised to call back or seek an in-person evaluation if the symptoms worsen or if the condition fails to improve as anticipated.      Review of Systems     Objective:   Physical Exam        Assessment & Plan:

## 2022-04-18 ENCOUNTER — Telehealth: Payer: Self-pay | Admitting: Family Medicine

## 2022-04-18 ENCOUNTER — Other Ambulatory Visit: Payer: Self-pay

## 2022-04-18 MED ORDER — FLUCONAZOLE 150 MG PO TABS: 150.0000 mg | ORAL_TABLET | Freq: Every day | ORAL | 5 refills | Status: DC

## 2022-04-18 NOTE — Telephone Encounter (Signed)
Pt called to request a refill of the:  Diflucane   Pt thinks she may a yeast infection.  Please advise.

## 2022-04-18 NOTE — Telephone Encounter (Signed)
Spoke with patient.   Prescription for Diflucan 150mg  sent to [facility] on [address]

## 2022-04-18 NOTE — Telephone Encounter (Signed)
Call in Diflucan 150 mg, #1 with 5 rf

## 2022-04-18 NOTE — Telephone Encounter (Signed)
Spoke with pt state that she has yeast infection from the recent antibiotic she was taking, Please advise if ok to send Rx for Diflucan

## 2022-05-03 IMAGING — MG MM DIGITAL SCREENING BILAT W/ TOMO AND CAD
8 series · 9 of 24 positions shown · non-contrast
Comparison: Previous exam(s).

CLINICAL DATA: Screening.

EXAM:
DIGITAL SCREENING BILATERAL MAMMOGRAM WITH TOMOSYNTHESIS AND CAD
TECHNIQUE: Bilateral screening digital craniocaudal and mediolateral oblique
mammograms were obtained. Bilateral screening digital breast
tomosynthesis was performed. The images were evaluated with
computer-aided detection.

[R CC synth-2D]
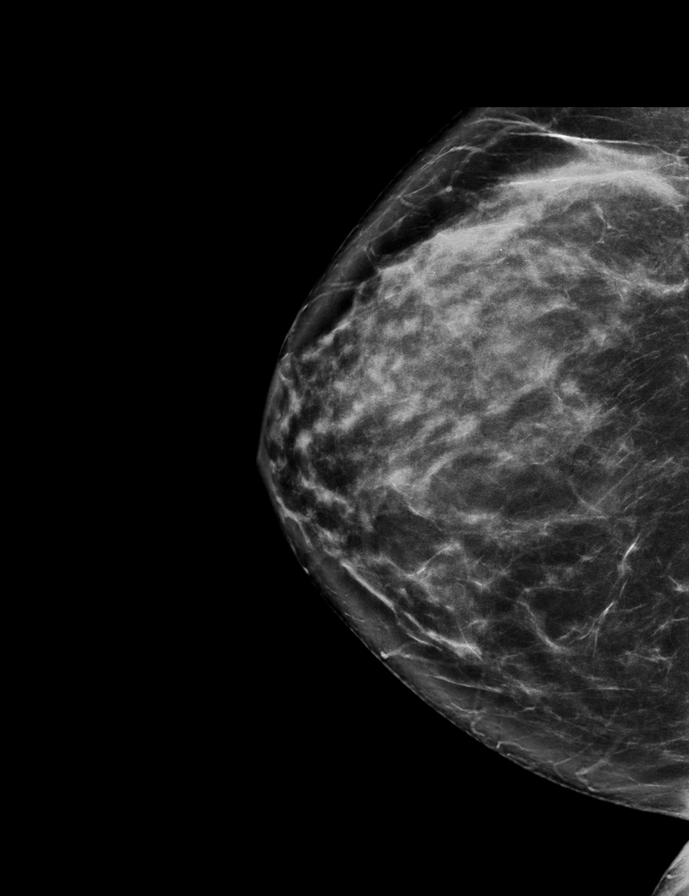

[L CC synth-2D]
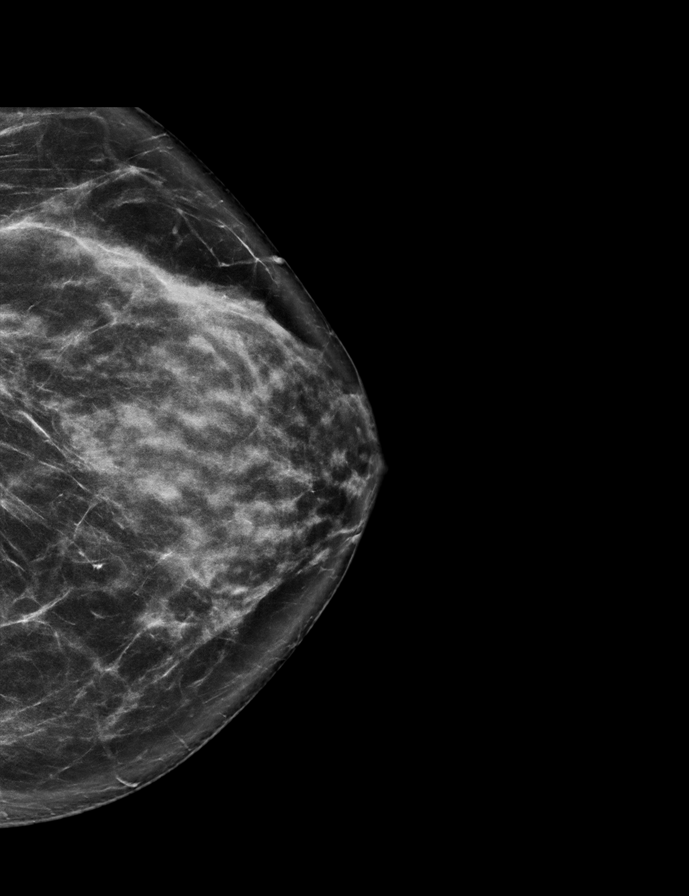

[L MLO synth-2D]
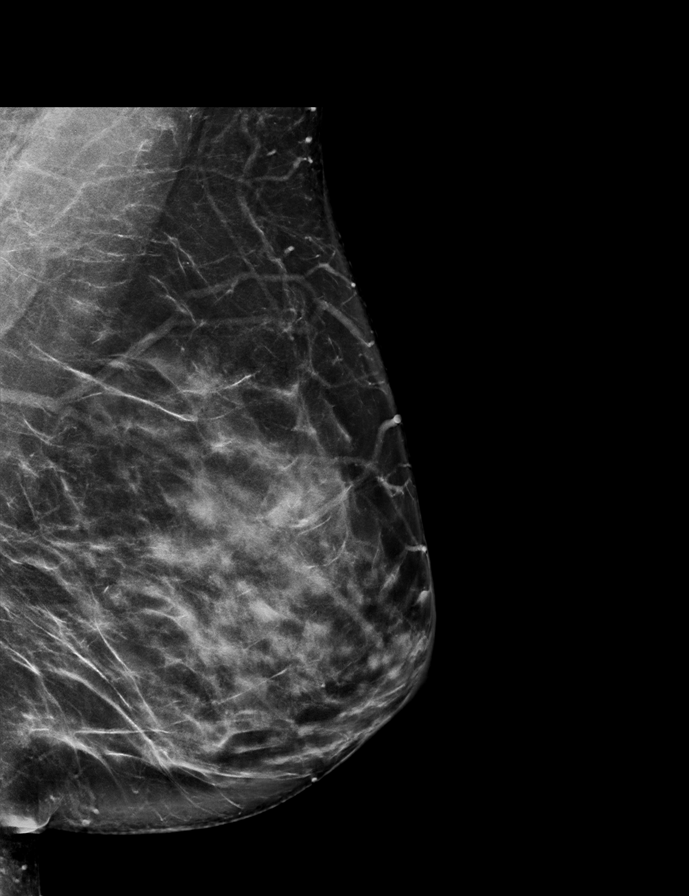

[R MLO synth-2D]
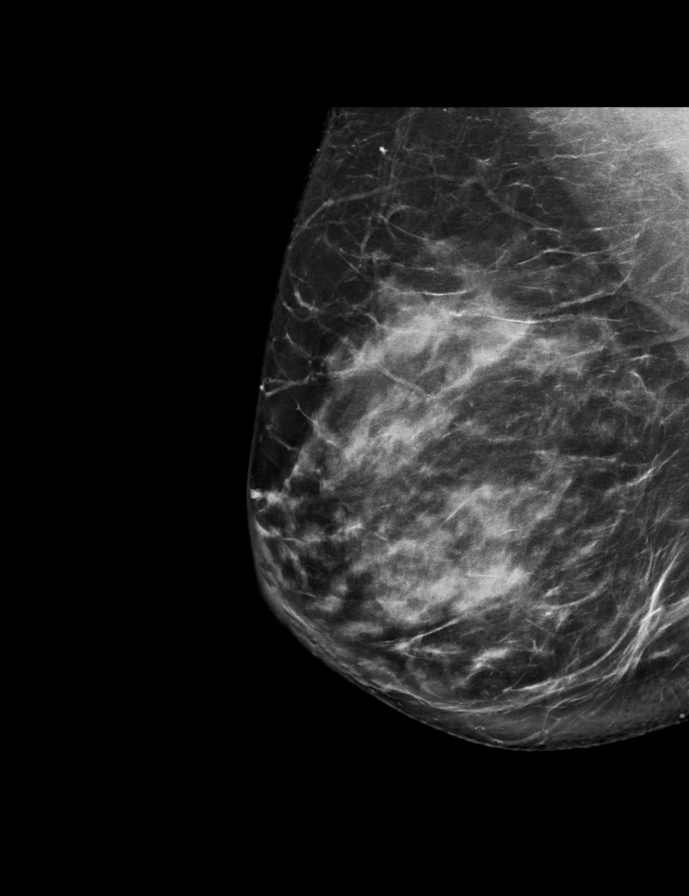

[L CC tomo · 2 of 74 frames shown]
[frame 24/74]
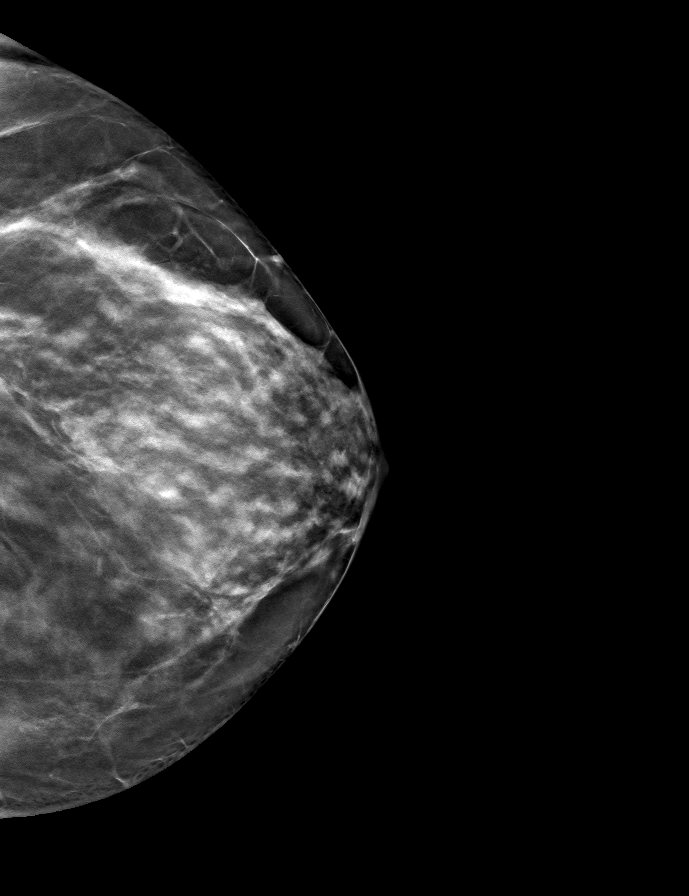
[frame 37/74]
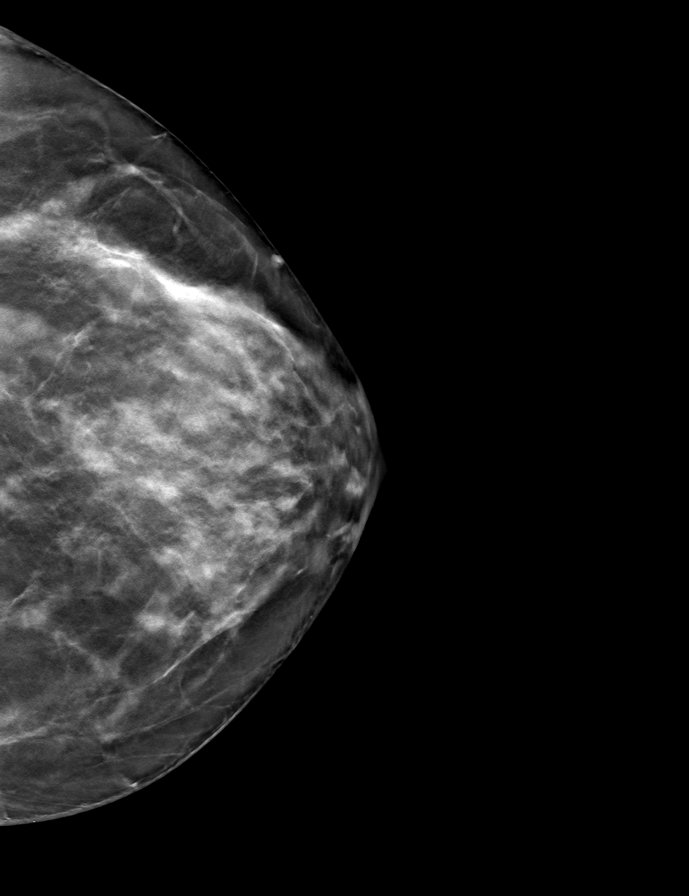

[R CC tomo · tomo slice 39/78.0]
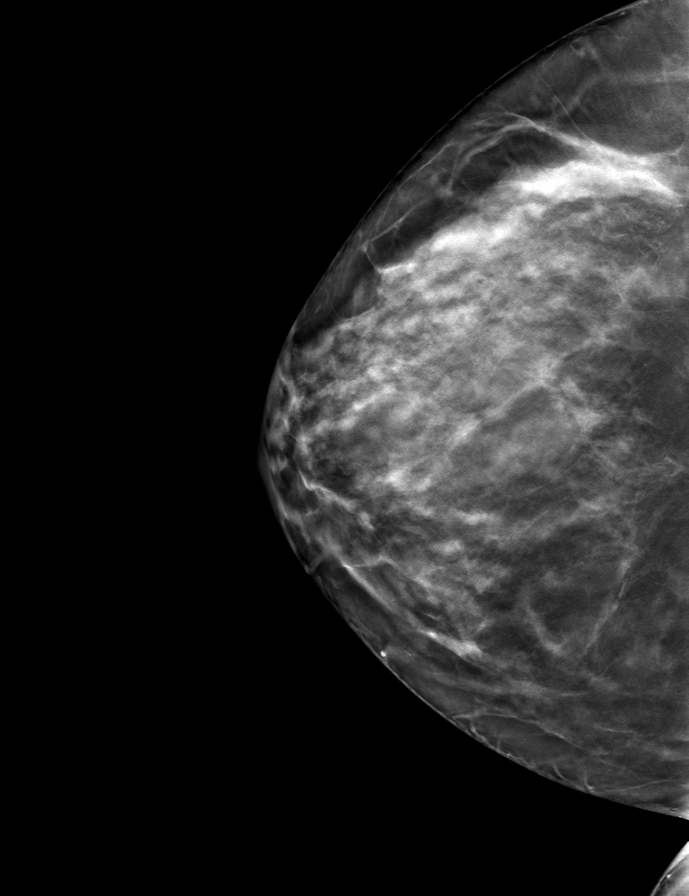

[L MLO tomo · tomo slice 43/84.0]
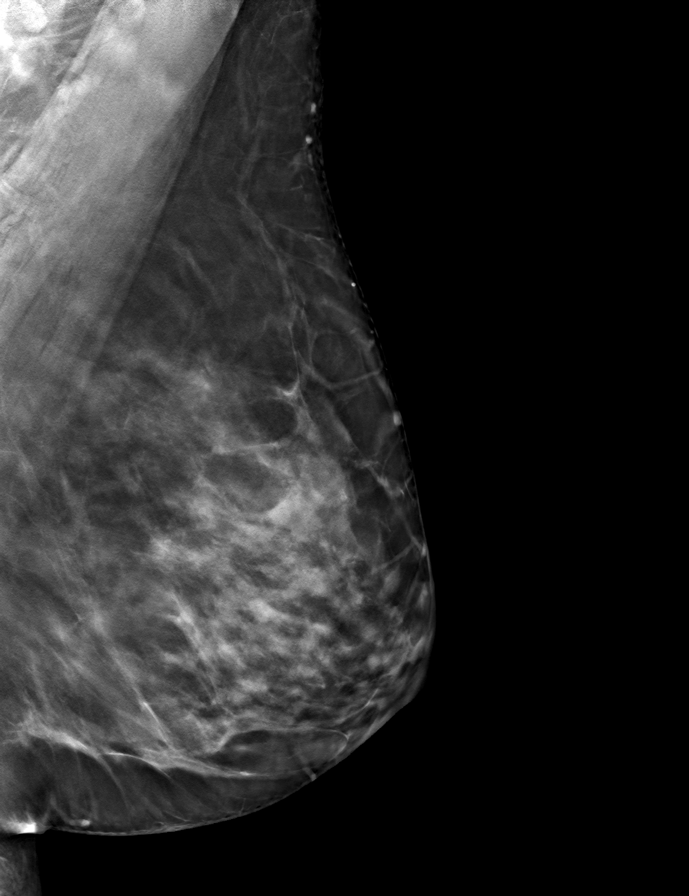

[R MLO tomo · tomo slice 43/85.0]
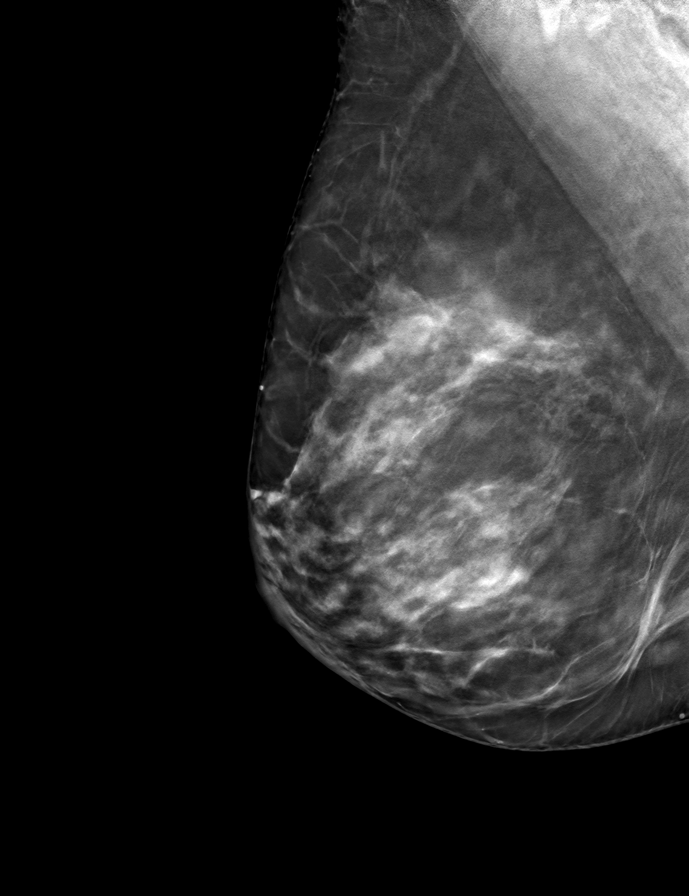

[9 of 24 positions shown; findings below may reference images not displayed]

ACR Breast Density Category c: The breast tissue is heterogeneously
dense, which may obscure small masses.
FINDINGS: There are no findings suspicious for malignancy.
IMPRESSION: No mammographic evidence of malignancy. A result letter of this
screening mammogram will be mailed directly to the patient.

RECOMMENDATION:
Screening mammogram in one year. (Code:Q3-W-BC3)

BI-RADS CATEGORY  1: Negative.

## 2022-07-17 ENCOUNTER — Encounter: Payer: Self-pay | Admitting: Family Medicine

## 2022-07-17 ENCOUNTER — Ambulatory Visit: Payer: BC Managed Care – PPO | Admitting: Family Medicine

## 2022-07-17 VITALS — BP 110/82 | HR 72 | Temp 98.8°F | Wt 144.0 lb

## 2022-07-17 DIAGNOSIS — F418 Other specified anxiety disorders: Secondary | ICD-10-CM

## 2022-07-17 MED ORDER — ESCITALOPRAM OXALATE 20 MG PO TABS: 20.0000 mg | ORAL_TABLET | Freq: Every day | ORAL | 5 refills | Status: DC

## 2022-07-17 NOTE — Progress Notes (Signed)
   Subjective:    Patient ID: Kayla Shaw, female    DOB: 02-02-1971, 50 y.o.   MRN: [id number]  HPI Here asking for help with anxiety and depression. She has been under a lot of stress between being a single mother and her job. She always feels tense and worries about things. Sleep has been difficult because she wakes up a lot thinking about things. She still sees her therapist monthly. She had been taking Lexapro up until about a year ago when she took herself off it. She would like to try it again.    Review of Systems  Constitutional: Negative.   Respiratory: Negative.    Cardiovascular: Negative.   Psychiatric/Behavioral:  Positive for decreased concentration and sleep disturbance. Negative for agitation, behavioral problems, confusion, self-injury and suicidal ideas. The patient is nervous/anxious.        Objective:   Physical Exam Constitutional:      Appearance: Normal appearance.  Cardiovascular:     Rate and Rhythm: Normal rate and regular rhythm.     Pulses: Normal pulses.     Heart sounds: Normal heart sounds.  Pulmonary:     Effort: Pulmonary effort is normal.     Breath sounds: Normal breath sounds.  Neurological:     Mental Status: She is alert.  Psychiatric:        Mood and Affect: Mood normal.        Behavior: Behavior normal.        Thought Content: Thought content normal.           Assessment & Plan:  Anxiety with depression. She will start back on Lexapro 20 mg daily. We will follow up on this at her upcoming well exam.  Alysia Penna, MD

## 2022-08-28 ENCOUNTER — Encounter: Payer: Self-pay | Admitting: Family Medicine

## 2022-08-28 ENCOUNTER — Ambulatory Visit (INDEPENDENT_AMBULATORY_CARE_PROVIDER_SITE_OTHER): Payer: BC Managed Care – PPO | Admitting: Family Medicine

## 2022-08-28 VITALS — BP 120/86 | HR 67 | Temp 98.5°F | Ht 61.0 in | Wt 142.0 lb

## 2022-08-28 DIAGNOSIS — Z23 Encounter for immunization: Secondary | ICD-10-CM | POA: Diagnosis not present

## 2022-08-28 DIAGNOSIS — Z0000 Encounter for general adult medical examination without abnormal findings: Secondary | ICD-10-CM

## 2022-08-28 LAB — CBC WITH DIFFERENTIAL/PLATELET
Basophils Absolute: 0 10*3/uL (ref 0.0–0.1)
Basophils Relative: 0.3 % (ref 0.0–3.0)
Eosinophils Absolute: 0.1 10*3/uL (ref 0.0–0.7)
Eosinophils Relative: 1.5 % (ref 0.0–5.0)
HCT: 40.4 % (ref 36.0–46.0)
Hemoglobin: 13.1 g/dL (ref 12.0–15.0)
Lymphocytes Relative: 48.8 % — ABNORMAL HIGH (ref 12.0–46.0)
Lymphs Abs: 1.7 10*3/uL (ref 0.7–4.0)
MCHC: 32.5 g/dL (ref 30.0–36.0)
MCV: 88.5 fl (ref 78.0–100.0)
Monocytes Absolute: 0.3 10*3/uL (ref 0.1–1.0)
Monocytes Relative: 8.1 % (ref 3.0–12.0)
Neutro Abs: 1.4 10*3/uL (ref 1.4–7.7)
Neutrophils Relative %: 41.3 % — ABNORMAL LOW (ref 43.0–77.0)
Platelets: 207 10*3/uL (ref 150.0–400.0)
RBC: 4.57 Mil/uL (ref 3.87–5.11)
RDW: 13.1 % (ref 11.5–15.5)
WBC: 3.4 10*3/uL — ABNORMAL LOW (ref 4.0–10.5)

## 2022-08-28 LAB — BASIC METABOLIC PANEL
BUN: 11 mg/dL (ref 6–23)
CO2: 29 mEq/L (ref 19–32)
Calcium: 10.4 mg/dL (ref 8.4–10.5)
Chloride: 103 mEq/L (ref 96–112)
Creatinine, Ser: 0.79 mg/dL (ref 0.40–1.20)
GFR: 86.73 mL/min (ref >60.00)
Glucose, Bld: 85 mg/dL (ref 70–99)
Potassium: 4.1 mEq/L (ref 3.5–5.1)
Sodium: 140 mEq/L (ref 135–145)

## 2022-08-28 LAB — LIPID PANEL
Cholesterol: 201 mg/dL — ABNORMAL HIGH (ref 0–200)
HDL: 48.8 mg/dL (ref >39.00)
LDL Cholesterol: 142 mg/dL — ABNORMAL HIGH (ref 0–99)
NonHDL: 152.07
Total CHOL/HDL Ratio: 4
Triglycerides: 50 mg/dL (ref 0.0–149.0)
VLDL: 10 mg/dL (ref 0.0–40.0)

## 2022-08-28 LAB — HEPATIC FUNCTION PANEL
ALT: 23 U/L (ref 0–35)
AST: 18 U/L (ref 0–37)
Albumin: 4.8 g/dL (ref 3.5–5.2)
Alkaline Phosphatase: 48 U/L (ref 39–117)
Bilirubin, Direct: 0.1 mg/dL (ref 0.0–0.3)
Total Bilirubin: 0.4 mg/dL (ref 0.2–1.2)
Total Protein: 7.9 g/dL (ref 6.0–8.3)

## 2022-08-28 LAB — TSH: TSH: 0.68 u[IU]/mL (ref 0.35–5.50)

## 2022-08-28 LAB — HEMOGLOBIN A1C: Hgb A1c MFr Bld: 6 % (ref 4.6–6.5)

## 2022-08-28 NOTE — Addendum Note (Signed)
Addended by: Encarnacion Slates on: 08/28/2022 11:30 AM   Modules accepted: Orders

## 2022-08-28 NOTE — Progress Notes (Signed)
   Subjective:    Patient ID: Altamease Oiler, female    DOB: [birth year], 51 y.o.   MRN: [id number]  HPI Here for a well exam. She feels great.    Review of Systems  Constitutional: Negative.   HENT: Negative.    Eyes: Negative.   Respiratory: Negative.    Cardiovascular: Negative.   Gastrointestinal: Negative.   Genitourinary:  Negative for decreased urine volume, difficulty urinating, dyspareunia, dysuria, enuresis, flank pain, frequency, hematuria, pelvic pain and urgency.  Musculoskeletal: Negative.   Skin: Negative.   Neurological: Negative.  Negative for headaches.  Psychiatric/Behavioral: Negative.         Objective:   Physical Exam Constitutional:      General: She is not in acute distress.    Appearance: Normal appearance. She is well-developed.  HENT:     Head: Normocephalic and atraumatic.     Right Ear: External ear normal.     Left Ear: External ear normal.     Nose: Nose normal.     Mouth/Throat:     Pharynx: No oropharyngeal exudate.  Eyes:     General: No scleral icterus.    Conjunctiva/sclera: Conjunctivae normal.     Pupils: Pupils are equal, round, and reactive to light.  Neck:     Thyroid: No thyromegaly.     Vascular: No JVD.  Cardiovascular:     Rate and Rhythm: Normal rate and regular rhythm.     Heart sounds: Normal heart sounds. No murmur heard.    No friction rub. No gallop.  Pulmonary:     Effort: Pulmonary effort is normal. No respiratory distress.     Breath sounds: Normal breath sounds. No wheezing or rales.  Chest:     Chest wall: No tenderness.  Abdominal:     General: Bowel sounds are normal. There is no distension.     Palpations: Abdomen is soft. There is no mass.     Tenderness: There is no abdominal tenderness. There is no guarding or rebound.  Musculoskeletal:        General: No tenderness. Normal range of motion.     Cervical back: Normal range of motion and neck supple.  Lymphadenopathy:     Cervical: No cervical  adenopathy.  Skin:    General: Skin is warm and dry.     Findings: No erythema or rash.  Neurological:     Mental Status: She is alert and oriented to person, place, and time.     Cranial Nerves: No cranial nerve deficit.     Motor: No abnormal muscle tone.     Coordination: Coordination normal.     Deep Tendon Reflexes: Reflexes are normal and symmetric. Reflexes normal.  Psychiatric:        Behavior: Behavior normal.        Thought Content: Thought content normal.        Judgment: Judgment normal.           Assessment & Plan:  Well exam. We discussed diet and exercise. Get fasting labs. Alysia Penna, MD

## 2022-09-03 ENCOUNTER — Ambulatory Visit (INDEPENDENT_AMBULATORY_CARE_PROVIDER_SITE_OTHER): Payer: BC Managed Care – PPO | Admitting: Family Medicine

## 2022-09-03 ENCOUNTER — Encounter: Payer: Self-pay | Admitting: Family Medicine

## 2022-09-03 VITALS — BP 112/76 | HR 76 | Temp 98.9°F | Wt 143.0 lb

## 2022-09-03 DIAGNOSIS — R059 Cough, unspecified: Secondary | ICD-10-CM | POA: Diagnosis not present

## 2022-09-03 DIAGNOSIS — J101 Influenza due to other identified influenza virus with other respiratory manifestations: Secondary | ICD-10-CM

## 2022-09-03 DIAGNOSIS — J020 Streptococcal pharyngitis: Secondary | ICD-10-CM | POA: Diagnosis not present

## 2022-09-03 LAB — POCT RAPID STREP A (OFFICE): Rapid Strep A Screen: POSITIVE — AB

## 2022-09-03 LAB — POCT INFLUENZA A/B
Influenza A, POC: POSITIVE — AB
Influenza B, POC: POSITIVE — AB

## 2022-09-03 LAB — POC COVID19 BINAXNOW: SARS Coronavirus 2 Ag: NEGATIVE

## 2022-09-03 MED ORDER — HYDROCODONE BIT-HOMATROP MBR 5-1.5 MG/5ML PO SOLN: 5.0000 mL | ORAL | 0 refills | Status: DC | PRN

## 2022-09-03 MED ORDER — CEFUROXIME AXETIL 500 MG PO TABS: 500.0000 mg | ORAL_TABLET | Freq: Two times a day (BID) | ORAL | 0 refills | Status: AC

## 2022-09-03 NOTE — Progress Notes (Signed)
   Subjective:    Patient ID: Kayla Shaw, female    DOB: [birth year], 51 y.o.   MRN: [id number]  HPI Here for one week of stuffy head, PND, ST, and coughing up green mucus. She has had some diarrhea but no nausea.    Review of Systems  Constitutional: Negative.   HENT:  Positive for congestion, postnasal drip and sore throat. Negative for ear pain.   Eyes: Negative.   Respiratory:  Positive for cough. Negative for shortness of breath and wheezing.        Objective:   Physical Exam Constitutional:      Appearance: Normal appearance. She is not ill-appearing.  HENT:     Right Ear: Tympanic membrane, ear canal and external ear normal.     Left Ear: Tympanic membrane, ear canal and external ear normal.     Nose: Nose normal.     Mouth/Throat:     Pharynx: Posterior oropharyngeal erythema present. No oropharyngeal exudate.  Eyes:     Conjunctiva/sclera: Conjunctivae normal.  Pulmonary:     Effort: Pulmonary effort is normal.     Breath sounds: Normal breath sounds.  Lymphadenopathy:     Cervical: No cervical adenopathy.  Neurological:     Mental Status: She is alert.           Assessment & Plan:  She has tested positive for strep as well as both influenza A and B. I feel moist of her symptoms are coming from the strep infection, so we will treat this with 10 days of Cefuroxime. Recheck as needed. Alysia Penna, MD

## 2022-09-03 NOTE — Addendum Note (Signed)
Addended by: Wyvonne Lenz on: 09/03/2022 09:48 AM   Modules accepted: Orders

## 2023-01-09 ENCOUNTER — Encounter: Payer: Self-pay | Admitting: Family Medicine

## 2023-01-09 ENCOUNTER — Ambulatory Visit: Payer: BC Managed Care – PPO | Admitting: Family Medicine

## 2023-01-09 VITALS — BP 118/78 | HR 63 | Temp 98.3°F | Wt 145.6 lb

## 2023-01-09 DIAGNOSIS — F418 Other specified anxiety disorders: Secondary | ICD-10-CM

## 2023-01-09 MED ORDER — VENLAFAXINE HCL ER 75 MG PO CP24: 75.0000 mg | ORAL_CAPSULE | Freq: Every day | ORAL | 2 refills | Status: DC

## 2023-01-09 NOTE — Progress Notes (Signed)
   Subjective:    Patient ID: Kayla Shaw, female    DOB: 23-Jan-1971, 52 y.o.   MRN: [id number]  HPI Here to discuss her stress levels. She says she still enjoys her job, but she has a "toxic boss" that makes her work very stressful. She has been on Lexapro for a long time, and it no longer helps the wat it used to.    Review of Systems  Constitutional: Negative.   Respiratory: Negative.    Cardiovascular: Negative.   Psychiatric/Behavioral:  Positive for dysphoric mood. Negative for agitation, behavioral problems, confusion, decreased concentration, hallucinations, self-injury, sleep disturbance and suicidal ideas. The patient is nervous/anxious.        Objective:   Physical Exam Constitutional:      Appearance: Normal appearance.  Cardiovascular:     Rate and Rhythm: Normal rate and regular rhythm.     Pulses: Normal pulses.     Heart sounds: Normal heart sounds.  Pulmonary:     Effort: Pulmonary effort is normal.     Breath sounds: Normal breath sounds.  Neurological:     Mental Status: She is alert and oriented to person, place, and time. Mental status is at baseline.  Psychiatric:        Behavior: Behavior normal.        Thought Content: Thought content normal.     Comments: Anxious            Assessment & Plan:  Anxiety with depression. We will stop Lexapro and start her on Effexor XR 75 mg daily. She will report back in 3-4 weeks.  Alysia Penna, MD

## 2023-01-14 ENCOUNTER — Other Ambulatory Visit: Payer: Self-pay | Admitting: Obstetrics and Gynecology

## 2023-01-14 DIAGNOSIS — Z1231 Encounter for screening mammogram for malignant neoplasm of breast: Secondary | ICD-10-CM

## 2023-02-27 ENCOUNTER — Ambulatory Visit
Admission: RE | Admit: 2023-02-27 | Discharge: 2023-02-27 | Disposition: A | Discharge status: Home / Self Care | Payer: BC Managed Care – PPO | Source: Ambulatory Visit | Attending: Obstetrics and Gynecology | Admitting: Obstetrics and Gynecology

## 2023-02-27 DIAGNOSIS — Z1231 Encounter for screening mammogram for malignant neoplasm of breast: Secondary | ICD-10-CM

## 2023-03-10 ENCOUNTER — Ambulatory Visit: Payer: BC Managed Care – PPO | Admitting: Family Medicine

## 2023-03-10 ENCOUNTER — Encounter: Payer: Self-pay | Admitting: Family Medicine

## 2023-03-10 VITALS — BP 116/78 | HR 75 | Temp 98.6°F | Wt 144.8 lb

## 2023-03-10 DIAGNOSIS — J0191 Acute recurrent sinusitis, unspecified: Secondary | ICD-10-CM | POA: Diagnosis not present

## 2023-03-10 MED ORDER — AMOXICILLIN-POT CLAVULANATE 875-125 MG PO TABS: 1.0000 | ORAL_TABLET | Freq: Two times a day (BID) | ORAL | 0 refills | Status: DC

## 2023-03-10 NOTE — Progress Notes (Signed)
   Subjective:    Patient ID: Kayla Shaw, female    DOB: 10-07-1971, 52 y.o.   MRN: [id number]  HPI Here for 3 weeks of sinus pressure, PND, and coughing up green sputum. No fever or ST. Using Nyquil.    Review of Systems  Constitutional: Negative.   HENT:  Positive for congestion, postnasal drip and sinus pressure. Negative for ear pain and sore throat.   Eyes: Negative.   Respiratory:  Positive for cough. Negative for shortness of breath and wheezing.        Objective:   Physical Exam Constitutional:      Appearance: Normal appearance.  HENT:     Right Ear: Tympanic membrane, ear canal and external ear normal.     Left Ear: Tympanic membrane, ear canal and external ear normal.     Nose: Nose normal.     Mouth/Throat:     Pharynx: Oropharynx is clear.  Eyes:     Conjunctiva/sclera: Conjunctivae normal.  Pulmonary:     Effort: Pulmonary effort is normal.     Breath sounds: Normal breath sounds.  Lymphadenopathy:     Cervical: No cervical adenopathy.  Neurological:     Mental Status: She is alert.           Assessment & Plan:  Sinusitis, treat with 10 days of Augmentin.  Gershon Crane, MD

## 2023-04-04 ENCOUNTER — Other Ambulatory Visit: Payer: Self-pay | Admitting: Family Medicine

## 2023-07-10 ENCOUNTER — Other Ambulatory Visit: Payer: Self-pay | Admitting: Family Medicine

## 2023-09-09 ENCOUNTER — Ambulatory Visit: Payer: BC Managed Care – PPO | Admitting: Family Medicine

## 2023-09-09 ENCOUNTER — Encounter: Payer: Self-pay | Admitting: Family Medicine

## 2023-09-09 VITALS — BP 118/80 | HR 81 | Temp 99.0°F | Wt 145.0 lb

## 2023-09-09 DIAGNOSIS — J019 Acute sinusitis, unspecified: Secondary | ICD-10-CM | POA: Diagnosis not present

## 2023-09-09 DIAGNOSIS — J029 Acute pharyngitis, unspecified: Secondary | ICD-10-CM

## 2023-09-09 DIAGNOSIS — R059 Cough, unspecified: Secondary | ICD-10-CM | POA: Diagnosis not present

## 2023-09-09 LAB — POC COVID19 BINAXNOW: SARS Coronavirus 2 Ag: NEGATIVE

## 2023-09-09 LAB — POCT RAPID STREP A (OFFICE): Rapid Strep A Screen: NEGATIVE

## 2023-09-09 LAB — POCT INFLUENZA A/B
Influenza A, POC: NEGATIVE
Influenza B, POC: NEGATIVE

## 2023-09-09 MED ORDER — AMOXICILLIN-POT CLAVULANATE 875-125 MG PO TABS: 1.0000 | ORAL_TABLET | Freq: Two times a day (BID) | ORAL | 0 refills | Status: DC

## 2023-09-09 MED ORDER — HYDROCODONE BIT-HOMATROP MBR 5-1.5 MG/5ML PO SOLN: 5.0000 mL | ORAL | 0 refills | Status: DC | PRN

## 2023-09-09 NOTE — Progress Notes (Signed)
   Subjective:    Patient ID: Kayla Shaw, female    DOB: 17-May-1971, 52 y.o.   MRN: [id number]  HPI Here for 5 days of sinus pressure, PND, ST, and a dry cough. No fever. Taking Nyquil.    Review of Systems  Constitutional: Negative.   HENT:  Positive for congestion, postnasal drip and sinus pain. Negative for ear pain, facial swelling and sore throat.   Eyes: Negative.   Respiratory:  Positive for cough. Negative for shortness of breath and wheezing.        Objective:   Physical Exam Constitutional:      Appearance: Normal appearance. She is not ill-appearing.  HENT:     Right Ear: Tympanic membrane, ear canal and external ear normal.     Left Ear: Tympanic membrane, ear canal and external ear normal.     Nose: Nose normal.     Mouth/Throat:     Pharynx: Oropharynx is clear.  Eyes:     Conjunctiva/sclera: Conjunctivae normal.  Pulmonary:     Effort: Pulmonary effort is normal.     Breath sounds: Normal breath sounds.  Lymphadenopathy:     Cervical: No cervical adenopathy.  Neurological:     Mental Status: She is alert.           Assessment & Plan:  Sinusitis, treat with 10 days of Augmentin.  Gershon Crane, MD

## 2023-10-09 ENCOUNTER — Other Ambulatory Visit: Payer: Self-pay | Admitting: Family Medicine

## 2024-01-07 ENCOUNTER — Ambulatory Visit (INDEPENDENT_AMBULATORY_CARE_PROVIDER_SITE_OTHER): Admitting: Family Medicine

## 2024-01-07 ENCOUNTER — Encounter: Payer: Self-pay | Admitting: Family Medicine

## 2024-01-07 VITALS — BP 128/84 | HR 113 | Temp 100.7°F | Ht 61.5 in | Wt 146.7 lb

## 2024-01-07 DIAGNOSIS — U071 COVID-19: Secondary | ICD-10-CM | POA: Diagnosis not present

## 2024-01-07 DIAGNOSIS — R509 Fever, unspecified: Secondary | ICD-10-CM | POA: Diagnosis not present

## 2024-01-07 LAB — POC COVID19 BINAXNOW: SARS Coronavirus 2 Ag: POSITIVE — AB

## 2024-01-07 LAB — POCT INFLUENZA A/B
Influenza A, POC: NEGATIVE
Influenza B, POC: NEGATIVE

## 2024-01-07 MED ORDER — NIRMATRELVIR/RITONAVIR (PAXLOVID)TABLET: 3.0000 | ORAL_TABLET | Freq: Two times a day (BID) | ORAL | 0 refills | Status: AC

## 2024-01-07 NOTE — Progress Notes (Signed)
   Subjective:    Patient ID: Kayla Shaw, female    DOB: Mar 26, 1971, 53 y.o.   MRN: [id number]  HPI Here for the onset last night of fever, body aches, and a dry cough. No SOB.    Review of Systems  Constitutional:  Positive for fever.  HENT:  Positive for congestion. Negative for ear pain, postnasal drip, sinus pressure and sore throat.   Eyes: Negative.   Respiratory:  Positive for cough. Negative for shortness of breath and wheezing.   Gastrointestinal: Negative.        Objective:   Physical Exam Constitutional:      Appearance: Normal appearance.  HENT:     Right Ear: Tympanic membrane, ear canal and external ear normal.     Left Ear: Tympanic membrane, ear canal and external ear normal.     Nose: Nose normal.     Mouth/Throat:     Pharynx: Oropharynx is clear.  Eyes:     Conjunctiva/sclera: Conjunctivae normal.  Pulmonary:     Effort: Pulmonary effort is normal.     Breath sounds: Normal breath sounds.  Lymphadenopathy:     Cervical: No cervical adenopathy.  Neurological:     Mental Status: She is alert.           Assessment & Plan:  Covid infection, treat with 5 days of Paxlovid. She is written out of work until 01-12-24.  Gershon Crane, MD

## 2024-01-09 ENCOUNTER — Other Ambulatory Visit: Payer: Self-pay | Admitting: Family Medicine

## 2024-02-03 ENCOUNTER — Ambulatory Visit (INDEPENDENT_AMBULATORY_CARE_PROVIDER_SITE_OTHER): Admitting: Family Medicine

## 2024-02-03 ENCOUNTER — Encounter: Payer: Self-pay | Admitting: Family Medicine

## 2024-02-03 VITALS — BP 118/72 | HR 68 | Temp 98.4°F | Ht 61.5 in | Wt 147.6 lb

## 2024-02-03 DIAGNOSIS — Z0000 Encounter for general adult medical examination without abnormal findings: Secondary | ICD-10-CM

## 2024-02-03 DIAGNOSIS — Z131 Encounter for screening for diabetes mellitus: Secondary | ICD-10-CM

## 2024-02-03 LAB — TSH: TSH: 1.05 u[IU]/mL (ref 0.35–5.50)

## 2024-02-03 LAB — CBC WITH DIFFERENTIAL/PLATELET
Basophils Absolute: 0 10*3/uL (ref 0.0–0.1)
Basophils Relative: 0.4 % (ref 0.0–3.0)
Eosinophils Absolute: 0.1 10*3/uL (ref 0.0–0.7)
Eosinophils Relative: 1.9 % (ref 0.0–5.0)
HCT: 40 % (ref 36.0–46.0)
Hemoglobin: 13.4 g/dL (ref 12.0–15.0)
Lymphocytes Relative: 42.6 % (ref 12.0–46.0)
Lymphs Abs: 1.5 10*3/uL (ref 0.7–4.0)
MCHC: 33.5 g/dL (ref 30.0–36.0)
MCV: 89.1 fl (ref 78.0–100.0)
Monocytes Absolute: 0.3 10*3/uL (ref 0.1–1.0)
Monocytes Relative: 7.7 % (ref 3.0–12.0)
Neutro Abs: 1.7 10*3/uL (ref 1.4–7.7)
Neutrophils Relative %: 47.4 % (ref 43.0–77.0)
Platelets: 208 10*3/uL (ref 150.0–400.0)
RBC: 4.49 Mil/uL (ref 3.87–5.11)
RDW: 13.8 % (ref 11.5–15.5)
WBC: 3.5 10*3/uL — ABNORMAL LOW (ref 4.0–10.5)

## 2024-02-03 LAB — BASIC METABOLIC PANEL WITH GFR
BUN: 13 mg/dL (ref 6–23)
CO2: 28 meq/L (ref 19–32)
Calcium: 10.3 mg/dL (ref 8.4–10.5)
Chloride: 102 meq/L (ref 96–112)
Creatinine, Ser: 0.72 mg/dL (ref 0.40–1.20)
GFR: 95.98 mL/min (ref >60.00)
Glucose, Bld: 94 mg/dL (ref 70–99)
Potassium: 4.3 meq/L (ref 3.5–5.1)
Sodium: 139 meq/L (ref 135–145)

## 2024-02-03 LAB — LIPID PANEL
Cholesterol: 238 mg/dL — ABNORMAL HIGH (ref 0–200)
HDL: 57 mg/dL (ref >39.00)
LDL Cholesterol: 166 mg/dL — ABNORMAL HIGH (ref 0–99)
NonHDL: 181.19
Total CHOL/HDL Ratio: 4
Triglycerides: 78 mg/dL (ref 0.0–149.0)
VLDL: 15.6 mg/dL (ref 0.0–40.0)

## 2024-02-03 LAB — HEPATIC FUNCTION PANEL
ALT: 18 U/L (ref 0–35)
AST: 15 U/L (ref 0–37)
Albumin: 5.2 g/dL (ref 3.5–5.2)
Alkaline Phosphatase: 58 U/L (ref 39–117)
Bilirubin, Direct: 0.1 mg/dL (ref 0.0–0.3)
Total Bilirubin: 0.5 mg/dL (ref 0.2–1.2)
Total Protein: 7.8 g/dL (ref 6.0–8.3)

## 2024-02-03 LAB — HEMOGLOBIN A1C: Hgb A1c MFr Bld: 6.2 % (ref 4.6–6.5)

## 2024-02-03 MED ORDER — PHENTERMINE HCL 37.5 MG PO CAPS: 37.5000 mg | ORAL_CAPSULE | ORAL | 1 refills | Status: DC

## 2024-02-03 MED ORDER — VENLAFAXINE HCL ER 75 MG PO CP24
75.0000 mg | ORAL_CAPSULE | Freq: Every day | ORAL | 3 refills | Status: DC
Start: 2024-02-03 — End: 2024-07-19

## 2024-02-03 NOTE — Progress Notes (Signed)
   Subjective:    Patient ID: Kayla Shaw, female    DOB: 02/01/1971, 53 y.o.   MRN: [id number]  HPI Here for a well exam. She feels well, but she is frustrated by her inability to lose weight. She tries to watch her diet, but she admits to not getting regular exercise.    Review of Systems  Constitutional: Negative.   HENT: Negative.    Eyes: Negative.   Respiratory: Negative.    Cardiovascular: Negative.   Gastrointestinal: Negative.   Genitourinary:  Negative for decreased urine volume, difficulty urinating, dyspareunia, dysuria, enuresis, flank pain, frequency, hematuria, pelvic pain and urgency.  Musculoskeletal: Negative.   Skin: Negative.   Neurological: Negative.  Negative for headaches.  Psychiatric/Behavioral: Negative.         Objective:   Physical Exam Constitutional:      General: She is not in acute distress.    Appearance: Normal appearance. She is well-developed.  HENT:     Head: Normocephalic and atraumatic.     Right Ear: External ear normal.     Left Ear: External ear normal.     Nose: Nose normal.     Mouth/Throat:     Pharynx: No oropharyngeal exudate.  Eyes:     General: No scleral icterus.    Conjunctiva/sclera: Conjunctivae normal.     Pupils: Pupils are equal, round, and reactive to light.  Neck:     Thyroid: No thyromegaly.     Vascular: No JVD.  Cardiovascular:     Rate and Rhythm: Normal rate and regular rhythm.     Pulses: Normal pulses.     Heart sounds: Normal heart sounds. No murmur heard.    No friction rub. No gallop.  Pulmonary:     Effort: Pulmonary effort is normal. No respiratory distress.     Breath sounds: Normal breath sounds. No wheezing or rales.  Chest:     Chest wall: No tenderness.  Abdominal:     General: Bowel sounds are normal. There is no distension.     Palpations: Abdomen is soft. There is no mass.     Tenderness: There is no abdominal tenderness. There is no guarding or rebound.  Musculoskeletal:         General: No tenderness. Normal range of motion.     Cervical back: Normal range of motion and neck supple.  Lymphadenopathy:     Cervical: No cervical adenopathy.  Skin:    General: Skin is warm and dry.     Findings: No erythema or rash.  Neurological:     General: No focal deficit present.     Mental Status: She is alert and oriented to person, place, and time.     Cranial Nerves: No cranial nerve deficit.     Motor: No abnormal muscle tone.     Coordination: Coordination normal.     Deep Tendon Reflexes: Reflexes are normal and symmetric. Reflexes normal.  Psychiatric:        Mood and Affect: Mood normal.        Behavior: Behavior normal.        Thought Content: Thought content normal.        Judgment: Judgment normal.           Assessment & Plan:  Well exam. We discussed diet and exercise. Get fasting labs. She will try Phentermine 37.5 mg every morning to help lose weight.  Gershon Crane, MD

## 2024-02-04 ENCOUNTER — Other Ambulatory Visit (HOSPITAL_COMMUNITY): Payer: Self-pay

## 2024-02-04 ENCOUNTER — Telehealth: Payer: Self-pay | Admitting: Pharmacy Technician

## 2024-02-04 NOTE — Telephone Encounter (Signed)
 Pharmacy Patient Advocate Encounter   Received notification from CoverMyMeds that prior authorization for Phentermine HCl 37.5MG  capsules is required/requested.   Insurance verification completed.   The patient is insured through [facility] .   Per test claim: PA is required but I need to know the Primary Diagnosis and ICD-10 code in order to submit the PA  Thanks!

## 2024-02-08 ENCOUNTER — Other Ambulatory Visit (HOSPITAL_COMMUNITY): Payer: Self-pay

## 2024-02-08 NOTE — Telephone Encounter (Signed)
 Pharmacy Patient Advocate Encounter   Received notification from CoverMyMeds that prior authorization for Phentermine HCl 37.5MG  capsules is required/requested.   Insurance verification completed.   The patient is insured through [facility] .   Per test claim: PA required; PA submitted to above mentioned insurance via CoverMyMeds Key/confirmation #/EOC [id number] Status is pending

## 2024-02-08 NOTE — Telephone Encounter (Signed)
 Pharmacy Patient Advocate Encounter  Received notification from [facility] that Prior Authorization for PHENTERMINE 37MG  has been DENIED.  Full denial letter will be uploaded to the media tab. See denial reason below.   PA #/Case ID/Reference #: [id number]

## 2024-02-08 NOTE — Telephone Encounter (Signed)
 Please advise the primary Diagnoses for this Rx

## 2024-02-08 NOTE — Telephone Encounter (Signed)
 Use obesity E66.9

## 2024-02-09 ENCOUNTER — Other Ambulatory Visit: Payer: Self-pay

## 2024-02-09 ENCOUNTER — Telehealth: Payer: Self-pay | Admitting: *Deleted

## 2024-02-09 MED ORDER — ATORVASTATIN CALCIUM 10 MG PO TABS: 10.0000 mg | ORAL_TABLET | Freq: Every day | ORAL | 3 refills | Status: AC

## 2024-02-09 NOTE — Telephone Encounter (Signed)
 Copied from CRM [id number]. Topic: Clinical - Prescription Issue >> Feb 08, 2024  2:44 PM Elle L wrote: Reason for CRM: The patient states that she did not receive her Atorvastatin 10 mg daily prescription that was going to be called in for her due to her lab results on 4/10.

## 2024-02-09 NOTE — Telephone Encounter (Signed)
 Pt notified via My Chart

## 2024-05-03 ENCOUNTER — Ambulatory Visit: Payer: Self-pay

## 2024-05-03 NOTE — Telephone Encounter (Signed)
 FYI Only or Action Required?: FYI only for provider.  Patient was last seen in primary care on 02/03/2024 by Johnny Garnette LABOR, MD.  Called Nurse Triage reporting Insect Bite.  Symptoms began several days ago.  Interventions attempted: OTC medications: anti-itch spray.  Symptoms are: unchanged.  Triage Disposition: See PCP When Office is Open (Within 3 Days)  Patient/caregiver understands and will follow disposition?: Yes          Copied from CRM [id number]. Topic: Clinical - Red Word Triage >> May 03, 2024  8:23 AM Ernestene P wrote: Red Word that prompted transfer to Nurse Triage: pt got bitten Saturday and now has huge spots on legs   ----------------------------------------------------------------------- From previous Reason for Contact - Scheduling: Patient/patient representative is calling to schedule an appointment. Refer to attachments for appointment information. Reason for Disposition  Bite starts to look bad (e.g., blister, purplish skin, ulcer)  (Exception: There is just minor swelling or small red bump.)  Answer Assessment - Initial Assessment Questions 1. TYPE of INSECT: What type of insect was it?      unknown 2. ONSET: When did you get bitten?      Saturday 3. LOCATION: Where is the insect bite located?      Right leg side of calf 4. REDNESS: Is the area red or pink? If Yes, ask: What size is area of redness? (inches or cm). When did the redness start?     yes 5. PAIN: Is there any pain? If Yes, ask: How bad is it?  (Scale 1-10; or mild, moderate, severe)     Painful to touch  6. ITCHING: Does it itch? If Yes, ask: How bad is the itch?    - MILD: doesn't interfere with normal activities   - MODERATE-SEVERE: interferes with work, school, sleep, or other activities      mod 7. SWELLING: How big is the swelling? (inches, cm, or compare to coins)     no 8. OTHER SYMPTOMS: Do you have any other symptoms?  (e.g., difficulty breathing,  hives)     Looks like bruise around the bite about 3 in  Protocols used: Insect Bite-A-AH

## 2024-05-03 NOTE — Telephone Encounter (Signed)
 Patient has appt 7/9 with Dr. Johnny

## 2024-05-04 ENCOUNTER — Encounter: Payer: Self-pay | Admitting: Family Medicine

## 2024-05-04 ENCOUNTER — Ambulatory Visit: Admitting: Family Medicine

## 2024-05-04 VITALS — BP 112/72 | HR 82 | Temp 98.2°F | Ht 61.5 in | Wt 150.0 lb

## 2024-05-04 DIAGNOSIS — W57XXXA Bitten or stung by nonvenomous insect and other nonvenomous arthropods, initial encounter: Secondary | ICD-10-CM | POA: Diagnosis not present

## 2024-05-04 DIAGNOSIS — S80861A Insect bite (nonvenomous), right lower leg, initial encounter: Secondary | ICD-10-CM | POA: Diagnosis not present

## 2024-05-04 MED ORDER — DOXYCYCLINE HYCLATE 100 MG PO TABS: 100.0000 mg | ORAL_TABLET | Freq: Two times a day (BID) | ORAL | 0 refills | Status: DC

## 2024-05-04 NOTE — Progress Notes (Signed)
   Subjective:    Patient ID: Kayla Shaw Means, female    DOB: [birth year], 53 y.o.   MRN: [id number]  HPI Here for an apparent insect bite on the right calf that occurred 4 days ago. She did not feel a bite, but the area suddenly began to itch. She had spent that day visiting friends and sitting in their back yard. The next day the area around the psot began to swell and turn red. Now th redness has changed to a dark brown. She feels in general.    Review of Systems  Constitutional: Negative.   Respiratory: Negative.    Cardiovascular: Negative.   Musculoskeletal: Negative.   Skin:  Positive for color change.  Neurological: Negative.        Objective:   Physical Exam Constitutional:      Appearance: Normal appearance.  Cardiovascular:     Rate and Rhythm: Normal rate and regular rhythm.     Pulses: Normal pulses.     Heart sounds: Normal heart sounds.  Pulmonary:     Effort: Pulmonary effort is normal.     Breath sounds: Normal breath sounds.  Skin:    Comments: There is a tiny bite mark on the right calf that is surrounded by a 4 cm area of ecchymosis and swelling. No warmth or tenderness.   Neurological:     Mental Status: She is alert.           Assessment & Plan:  Insect bite, likely from either a tick or a spider. We will cover with 10 days of Doxycycline . She can also apply ice as needed.  Garnette Olmsted, MD

## 2024-06-02 ENCOUNTER — Other Ambulatory Visit: Payer: Self-pay | Admitting: Obstetrics and Gynecology

## 2024-06-02 DIAGNOSIS — Z1231 Encounter for screening mammogram for malignant neoplasm of breast: Secondary | ICD-10-CM

## 2024-06-21 ENCOUNTER — Ambulatory Visit
Admission: RE | Admit: 2024-06-21 | Discharge: 2024-06-21 | Disposition: A | Discharge status: Home / Self Care | Source: Ambulatory Visit | Attending: Obstetrics and Gynecology | Admitting: Obstetrics and Gynecology

## 2024-06-21 DIAGNOSIS — Z1231 Encounter for screening mammogram for malignant neoplasm of breast: Secondary | ICD-10-CM

## 2024-06-24 ENCOUNTER — Other Ambulatory Visit: Payer: Self-pay | Admitting: Obstetrics and Gynecology

## 2024-06-24 DIAGNOSIS — R928 Other abnormal and inconclusive findings on diagnostic imaging of breast: Secondary | ICD-10-CM

## 2024-07-01 ENCOUNTER — Ambulatory Visit

## 2024-07-01 ENCOUNTER — Ambulatory Visit
Admission: RE | Admit: 2024-07-01 | Discharge: 2024-07-01 | Disposition: A | Discharge status: Home / Self Care | Source: Ambulatory Visit | Attending: Obstetrics and Gynecology | Admitting: Obstetrics and Gynecology

## 2024-07-01 DIAGNOSIS — R928 Other abnormal and inconclusive findings on diagnostic imaging of breast: Secondary | ICD-10-CM

## 2024-07-18 ENCOUNTER — Ambulatory Visit: Payer: Self-pay

## 2024-07-18 NOTE — Telephone Encounter (Signed)
 FYI Only or Action Required?: FYI only for provider.  Patient was last seen in primary care on 05/04/2024 by Kayla Shaw LABOR, MD.  Called Nurse Triage reporting Joint pain for 1 month, Mild HA starting last night.  Symptoms began about a month ago.  Interventions attempted: OTC medications: Tylenol .  Symptoms are: unchanged.  Triage Disposition: See PCP When Office is Open (Within 3 Days)  Patient/caregiver understands and will follow disposition?: Yes                      Copied from CRM [id number]. Topic: Clinical - Red Word Triage >> Jul 18, 2024 10:03 AM Kayla Shaw wrote: Red Word that prompted transfer to Nurse Triage: slight headache and joint pain Reason for Disposition  [1] MODERATE pain (e.g., interferes with normal activities) AND [2] present > 3 days  Answer Assessment - Initial Assessment Questions 1. ONSET: When did the muscle aches or body pains start?      Finger for months - 1 month for the other body pain. 2. LOCATION: What part of your body is hurting? (e.g., entire body, arms, legs)      Hands elbows, shoulder pain, neck pain 3. SEVERITY: How bad is the pain? (Scale 1-10; or mild, moderate, severe)     6-7/10 4. CAUSE: What do you think is causing the pains?     Stress?  5. FEVER: Do you have a fever? If Yes, ask: What is your temperature, how was it measured, and  when did it start?      no 6. OTHER SYMPTOMS: Do you have any other symptoms? (e.g., chest pain, cold or flu symptoms, rash, weakness, weight loss)     Nausea since Friday  Protocols used: Muscle Aches and Body Pain-A-AH

## 2024-07-18 NOTE — Telephone Encounter (Signed)
 FYI Pt has appointment with Dr Johnny on 07/19/24

## 2024-07-19 ENCOUNTER — Encounter: Payer: Self-pay | Admitting: Family Medicine

## 2024-07-19 ENCOUNTER — Ambulatory Visit: Admitting: Family Medicine

## 2024-07-19 VITALS — BP 110/76 | HR 76 | Temp 98.3°F | Wt 148.4 lb

## 2024-07-19 DIAGNOSIS — F418 Other specified anxiety disorders: Secondary | ICD-10-CM | POA: Diagnosis not present

## 2024-07-19 DIAGNOSIS — M150 Primary generalized (osteo)arthritis: Secondary | ICD-10-CM | POA: Diagnosis not present

## 2024-07-19 MED ORDER — VENLAFAXINE HCL ER 150 MG PO CP24: 150.0000 mg | ORAL_CAPSULE | Freq: Every day | ORAL | 3 refills | Status: AC

## 2024-07-19 MED ORDER — LORAZEPAM 0.5 MG PO TABS: 0.5000 mg | ORAL_TABLET | Freq: Two times a day (BID) | ORAL | 2 refills | Status: DC | PRN

## 2024-07-19 MED ORDER — MELOXICAM 15 MG PO TABS: 15.0000 mg | ORAL_TABLET | Freq: Every day | ORAL | 3 refills | Status: AC

## 2024-07-19 NOTE — Progress Notes (Signed)
   Subjective:    Patient ID: Kayla Shaw, female    DOB: 06/06/1971, 53 y.o.   MRN: [id number]  HPI Here for 2 issues. First her stress levels have been very high lately, primarily due to job issues. She has asked to be moved to a different department at her job, and this is being discussed. She worries about things and she has trouble sleeping. Also she has had increasd stiffness and pain in her hands and her shoulders. She saw Orthopedics a few years ago, and she was diagnosed with osteoarthritis. She takes Ibuprofen  as needed.    Review of Systems  Constitutional: Negative.   Respiratory: Negative.    Cardiovascular: Negative.   Musculoskeletal:  Positive for arthralgias.  Psychiatric/Behavioral:  Positive for dysphoric mood and sleep disturbance. Negative for agitation and behavioral problems. The patient is nervous/anxious.        Objective:   Physical Exam Constitutional:      Appearance: Normal appearance.  Cardiovascular:     Rate and Rhythm: Normal rate and regular rhythm.     Pulses: Normal pulses.     Heart sounds: Normal heart sounds.  Pulmonary:     Effort: Pulmonary effort is normal.     Breath sounds: Normal breath sounds.  Neurological:     Mental Status: She is alert.  Psychiatric:        Mood and Affect: Mood normal.        Behavior: Behavior normal.        Thought Content: Thought content normal.           Assessment & Plan:  For her anxiety with depression, we will increase the Venlafaxine  XR to 150 mg daily. We will add Lorazepam  0.5 mg to use as needed. She is written out of work today and tomorrow. She also has OA, and she will try taking Meloxicam  15 mg daily.  Garnette Olmsted, MD

## 2024-09-09 ENCOUNTER — Encounter: Payer: Self-pay | Admitting: Family Medicine

## 2024-09-09 ENCOUNTER — Ambulatory Visit: Admitting: Family Medicine

## 2024-09-09 VITALS — BP 108/64 | HR 88 | Temp 98.3°F | Wt 145.0 lb

## 2024-09-09 DIAGNOSIS — F418 Other specified anxiety disorders: Secondary | ICD-10-CM

## 2024-09-09 MED ORDER — LORAZEPAM 1 MG PO TABS: 1.0000 mg | ORAL_TABLET | Freq: Three times a day (TID) | ORAL | 1 refills | Status: AC | PRN

## 2024-09-09 NOTE — Progress Notes (Signed)
   Subjective:    Patient ID: Kayla Shaw, female    DOB: December 11, 1970, 53 y.o.   MRN: [id number]  HPI Here to follow up on anxiety and depression. The source of her stress is a boss on her job that makes life difficult. At our last visit we increased the Venlafaxine  XR to 150 mg daily and we added Lorazepam  0.5 mg to use as needed. This has helped, but she finds that the Lorazepam  works better if she takes 2 at a time. Meanwhile she has put in for a transfer to another department, and hopefully this will work out.    Review of Systems  Constitutional: Negative.   Respiratory: Negative.    Cardiovascular: Negative.   Psychiatric/Behavioral:  Positive for decreased concentration and dysphoric mood. Negative for agitation, behavioral problems, hallucinations and sleep disturbance. The patient is nervous/anxious.        Objective:   Physical Exam Constitutional:      Appearance: Normal appearance.  Cardiovascular:     Rate and Rhythm: Normal rate and regular rhythm.     Pulses: Normal pulses.     Heart sounds: Normal heart sounds.  Pulmonary:     Effort: Pulmonary effort is normal.     Breath sounds: Normal breath sounds.  Neurological:     Mental Status: She is alert.  Psychiatric:        Behavior: Behavior normal.        Thought Content: Thought content normal.     Comments: She is anxious            Assessment & Plan:  Anxiety with depression. We will increase the Lorazepam  to 1 mg as needed. She plans on bringing us  some FMLA forms to fill out next week.  Garnette Olmsted, MD
